# Patient Record
Sex: Female | Born: 1945 | Race: White | Hispanic: No | Marital: Married | Smoking: Former smoker
Health system: Southern US, Community
[De-identification: ages and names within clinical notes are randomized; demographics above are authoritative.]

## PROBLEM LIST (undated history)

## (undated) DIAGNOSIS — K589 Irritable bowel syndrome without diarrhea: Secondary | ICD-10-CM

## (undated) DIAGNOSIS — M719 Bursopathy, unspecified: Secondary | ICD-10-CM

## (undated) DIAGNOSIS — T7840XA Allergy, unspecified, initial encounter: Secondary | ICD-10-CM

## (undated) DIAGNOSIS — F32A Depression, unspecified: Secondary | ICD-10-CM

## (undated) DIAGNOSIS — K5792 Diverticulitis of intestine, part unspecified, without perforation or abscess without bleeding: Secondary | ICD-10-CM

## (undated) HISTORY — DX: Irritable bowel syndrome, unspecified: K58.9

## (undated) HISTORY — PX: APPENDECTOMY: SHX54

## (undated) HISTORY — DX: Bursopathy, unspecified: M71.9

## (undated) HISTORY — DX: Diverticulitis of intestine, part unspecified, without perforation or abscess without bleeding: K57.92

## (undated) HISTORY — DX: Allergy, unspecified, initial encounter: T78.40XA

## (undated) HISTORY — PX: ABDOMINAL HYSTERECTOMY: SHX81

## (undated) HISTORY — DX: Depression, unspecified: F32.A

---

## 2000-12-05 ENCOUNTER — Ambulatory Visit (HOSPITAL_COMMUNITY): Admission: RE | Admit: 2000-12-05 | Discharge: 2000-12-05 | Payer: Self-pay | Admitting: *Deleted

## 2004-08-05 ENCOUNTER — Other Ambulatory Visit: Admission: RE | Admit: 2004-08-05 | Discharge: 2004-08-05 | Payer: Self-pay | Admitting: Obstetrics and Gynecology

## 2004-08-11 ENCOUNTER — Encounter: Admission: RE | Admit: 2004-08-11 | Discharge: 2004-08-11 | Payer: Self-pay | Admitting: Obstetrics and Gynecology

## 2005-08-06 ENCOUNTER — Other Ambulatory Visit: Admission: RE | Admit: 2005-08-06 | Discharge: 2005-08-06 | Payer: Self-pay | Admitting: Obstetrics and Gynecology

## 2006-08-25 ENCOUNTER — Encounter: Admission: RE | Admit: 2006-08-25 | Discharge: 2006-08-25 | Payer: Self-pay | Admitting: Obstetrics and Gynecology

## 2006-08-31 ENCOUNTER — Encounter: Admission: RE | Admit: 2006-08-31 | Discharge: 2006-08-31 | Payer: Self-pay | Admitting: Obstetrics and Gynecology

## 2006-10-10 ENCOUNTER — Encounter: Admission: RE | Admit: 2006-10-10 | Discharge: 2006-10-10 | Payer: Self-pay | Admitting: Orthopedic Surgery

## 2007-03-03 ENCOUNTER — Ambulatory Visit (HOSPITAL_BASED_OUTPATIENT_CLINIC_OR_DEPARTMENT_OTHER): Admission: RE | Admit: 2007-03-03 | Discharge: 2007-03-03 | Payer: Self-pay | Admitting: Orthopedic Surgery

## 2007-09-05 ENCOUNTER — Encounter: Admission: RE | Admit: 2007-09-05 | Discharge: 2007-09-05 | Payer: Self-pay | Admitting: Family Medicine

## 2008-09-05 ENCOUNTER — Encounter: Admission: RE | Admit: 2008-09-05 | Discharge: 2008-09-05 | Payer: Self-pay | Admitting: Obstetrics and Gynecology

## 2009-09-08 ENCOUNTER — Encounter: Admission: RE | Admit: 2009-09-08 | Discharge: 2009-09-08 | Payer: Self-pay | Admitting: Obstetrics and Gynecology

## 2010-04-17 ENCOUNTER — Encounter: Admission: RE | Admit: 2010-04-17 | Discharge: 2010-04-17 | Payer: Self-pay | Admitting: Family Medicine

## 2010-08-03 ENCOUNTER — Other Ambulatory Visit: Payer: Self-pay | Admitting: Obstetrics and Gynecology

## 2010-08-03 DIAGNOSIS — Z1231 Encounter for screening mammogram for malignant neoplasm of breast: Secondary | ICD-10-CM

## 2010-09-10 ENCOUNTER — Ambulatory Visit: Payer: Self-pay

## 2010-09-11 ENCOUNTER — Ambulatory Visit: Payer: Self-pay

## 2010-09-17 ENCOUNTER — Ambulatory Visit
Admission: RE | Admit: 2010-09-17 | Discharge: 2010-09-17 | Disposition: A | Discharge status: Home / Self Care | Payer: Medicare Other | Source: Ambulatory Visit | Attending: Obstetrics and Gynecology | Admitting: Obstetrics and Gynecology

## 2010-09-17 DIAGNOSIS — Z1231 Encounter for screening mammogram for malignant neoplasm of breast: Secondary | ICD-10-CM

## 2010-10-13 NOTE — Op Note (Signed)
Erin Sampson, BLOODSAW              ACCOUNT NO.:  [id number]   MEDICAL RECORD NO.:  [id number]          PATIENT TYPE:  AMB   LOCATION:  DSC                          FACILITY:  [facility]   PHYSICIAN:  Cindee Salt, M.D.       DATE OF BIRTH:  06-Aug-1945   DATE OF PROCEDURE:  03/03/2007  DATE OF DISCHARGE:                               OPERATIVE REPORT   PREOPERATIVE DIAGNOSIS:  Wrist pain with ulnocarpal abutment, TFCC  tears, cyst lunate, right wrist.   POSTOPERATIVE DIAGNOSIS:  Wrist pain with ulnocarpal abutment, TFCC  tears, cyst lunate, right wrist, ulnar carpal ligament tear avulsion,  ulnocarpal abutment TFCC tear.   OPERATION:  Arthroscopy, debridement of volar ulnar ligament tear,  debridement TFCC, Gibson Ramp arthroplasty, right wrist.   SURGEON:  Cindee Salt, M.D.   ASSISTANT:  Carolyne Fiscal R.N.   ANESTHESIA:  General.   HISTORY:  The patient is a 65 year old female with a history of pain on  the ulnar aspect of her wrist.  She has had an MRI revealing a large  cyst in the ulnar aspect of her lunate, a TFCC tear, ulnocarpal abutment  with positive bone scan. She has elected to undergo arthroscopy with the  possibility of bone grafting, debridement of the TFCC and [facility]  arthroplasty.  She is aware of risks and complications including  infection, recurrence, injury to arteries, nerves, or tendons,  incomplete relief of symptoms, dystrophy.  In the preoperative area,  questions were encouraged and answered.  The extremity was marked by  both the patient and surgeon.  Antibiotic given.   PROCEDURE:  The patient is brought to the operating room where a general  anesthetic was carried out without difficulty.  She was prepped using  DuraPrep, supine position, right arm free.  The limb was placed in the  arthroscopy tower, 10 pounds traction applied, the joint inflated  through the 3/4 portal.  A transverse incision was made through skin  only, deepened with a hemostat, a blunt trocar was  used enter the joint.  The joint was inspected.  A small tear of the proximal aspect  scapholunate ligament was noted.  No gross instability was noted.  The  volar radial ligaments were intact.  The ulnar carpal ligaments were  found to be avulsed and pushed within the joint.  The TFCC showed a tear  with humping up of the TFCC by the ulnar head.  An irrigation catheter  was placed and secured, a 4/5 portal opened. After localization with a  22 gauge needle, the ulnar aspect of the joint was inspected.  A  significant injury to the ulnar side of the lunate was noted with an  avulsion of a large portion of articular cartilage.  The scope was then  reintroduced in the 3/4 portal and ArthroCare wand was used for the  doing a partial synovectomy, opening the triangle fibrocartilage complex  along with a full radius shaver.  The loose cartilage from the lunate  was then debrided. The subchondral bone was found to be intact.  The  large avulsion of the volar ligaments was noted.  These were enfolded  into the joint. The mid carpal joint was then inspected through the  radial mid carpal portal after localization with a 22 gauge needle.  The  scope was introduced.  The STT joint showed no significant changes.  There were no changes of the proximal capitate to the distal carpal  bones. Minimal changes with a type 2 lunate onto the proximal aspect of  the hamate. No gross instability of the lunotriquetral joint was noted.  The scope was then reintroduced into the 3/4 portal where complete  debridement of the volar ligaments was then performed using a suction  basket. The TFCC was removed with the suction basket, the ArthroCare  wand, and full radius shaver.  The cartilage remaining on the distal  ulna was then removed with a full radius shaver and a bur was then used  to remove approximately 2-3 mm of distal ulna.  X-rays confirmed that  there was complete removal of the ulna to approximately 1-2 mm  short of  the distal radius.  It was decided not to proceed with bone grafting of  the lunate in that this was probably secondary to the avulsion of the  volar ligaments rather than the ulnocarpal abutment. The instruments  were removed.  The portals were closed with interrupted 5-0 Vicryl  Rapide sutures.  A sterile compressive dressing and splint was applied.  The patient tolerated the procedure well and was taken to the recovery  room for observation in satisfactory condition.  She will be discharged  home to return to the [facility] [place] in one week on [place]  NX.           ______________________________  Cindee Salt, M.D.     GK/MEDQ  D:  03/03/2007  T:  03/04/2007  Job:  [id number]   cc:   C. Duane Lope, M.D.

## 2011-06-07 DIAGNOSIS — Z23 Encounter for immunization: Secondary | ICD-10-CM | POA: Diagnosis not present

## 2011-06-25 DIAGNOSIS — J329 Chronic sinusitis, unspecified: Secondary | ICD-10-CM | POA: Diagnosis not present

## 2011-07-15 DIAGNOSIS — L570 Actinic keratosis: Secondary | ICD-10-CM | POA: Diagnosis not present

## 2011-07-15 DIAGNOSIS — L708 Other acne: Secondary | ICD-10-CM | POA: Diagnosis not present

## 2011-08-05 DIAGNOSIS — L578 Other skin changes due to chronic exposure to nonionizing radiation: Secondary | ICD-10-CM | POA: Diagnosis not present

## 2011-08-05 DIAGNOSIS — L708 Other acne: Secondary | ICD-10-CM | POA: Diagnosis not present

## 2011-08-16 DIAGNOSIS — B9789 Other viral agents as the cause of diseases classified elsewhere: Secondary | ICD-10-CM | POA: Diagnosis not present

## 2011-08-19 DIAGNOSIS — L578 Other skin changes due to chronic exposure to nonionizing radiation: Secondary | ICD-10-CM | POA: Diagnosis not present

## 2011-08-19 DIAGNOSIS — L570 Actinic keratosis: Secondary | ICD-10-CM | POA: Diagnosis not present

## 2011-08-19 DIAGNOSIS — L708 Other acne: Secondary | ICD-10-CM | POA: Diagnosis not present

## 2011-09-27 DIAGNOSIS — M545 Low back pain: Secondary | ICD-10-CM | POA: Diagnosis not present

## 2011-09-27 DIAGNOSIS — M1990 Unspecified osteoarthritis, unspecified site: Secondary | ICD-10-CM | POA: Diagnosis not present

## 2011-10-08 DIAGNOSIS — R350 Frequency of micturition: Secondary | ICD-10-CM | POA: Diagnosis not present

## 2011-10-08 DIAGNOSIS — N390 Urinary tract infection, site not specified: Secondary | ICD-10-CM | POA: Diagnosis not present

## 2012-01-14 DIAGNOSIS — J069 Acute upper respiratory infection, unspecified: Secondary | ICD-10-CM | POA: Diagnosis not present

## 2012-03-13 ENCOUNTER — Other Ambulatory Visit: Payer: Self-pay | Admitting: Obstetrics and Gynecology

## 2012-03-13 DIAGNOSIS — Z1231 Encounter for screening mammogram for malignant neoplasm of breast: Secondary | ICD-10-CM

## 2012-04-10 ENCOUNTER — Ambulatory Visit
Admission: RE | Admit: 2012-04-10 | Discharge: 2012-04-10 | Disposition: A | Discharge status: Home / Self Care | Payer: Medicare Other | Source: Ambulatory Visit | Attending: Obstetrics and Gynecology | Admitting: Obstetrics and Gynecology

## 2012-04-10 DIAGNOSIS — Z1231 Encounter for screening mammogram for malignant neoplasm of breast: Secondary | ICD-10-CM | POA: Diagnosis not present

## 2012-06-28 DIAGNOSIS — L259 Unspecified contact dermatitis, unspecified cause: Secondary | ICD-10-CM | POA: Diagnosis not present

## 2012-06-28 DIAGNOSIS — L723 Sebaceous cyst: Secondary | ICD-10-CM | POA: Diagnosis not present

## 2012-08-01 DIAGNOSIS — J329 Chronic sinusitis, unspecified: Secondary | ICD-10-CM | POA: Diagnosis not present

## 2012-08-01 DIAGNOSIS — R42 Dizziness and giddiness: Secondary | ICD-10-CM | POA: Diagnosis not present

## 2012-08-25 DIAGNOSIS — J329 Chronic sinusitis, unspecified: Secondary | ICD-10-CM | POA: Diagnosis not present

## 2012-11-28 DIAGNOSIS — J069 Acute upper respiratory infection, unspecified: Secondary | ICD-10-CM | POA: Diagnosis not present

## 2013-03-14 DIAGNOSIS — H43399 Other vitreous opacities, unspecified eye: Secondary | ICD-10-CM | POA: Diagnosis not present

## 2013-03-15 ENCOUNTER — Other Ambulatory Visit: Payer: Self-pay | Admitting: Family Medicine

## 2013-03-15 DIAGNOSIS — H539 Unspecified visual disturbance: Secondary | ICD-10-CM

## 2013-03-15 DIAGNOSIS — R4182 Altered mental status, unspecified: Secondary | ICD-10-CM | POA: Diagnosis not present

## 2013-03-16 DIAGNOSIS — H539 Unspecified visual disturbance: Secondary | ICD-10-CM | POA: Diagnosis not present

## 2013-03-16 DIAGNOSIS — R4182 Altered mental status, unspecified: Secondary | ICD-10-CM | POA: Diagnosis not present

## 2013-03-20 DIAGNOSIS — R4182 Altered mental status, unspecified: Secondary | ICD-10-CM | POA: Diagnosis not present

## 2013-03-20 DIAGNOSIS — I369 Nonrheumatic tricuspid valve disorder, unspecified: Secondary | ICD-10-CM | POA: Diagnosis not present

## 2013-03-27 DIAGNOSIS — Z23 Encounter for immunization: Secondary | ICD-10-CM | POA: Diagnosis not present

## 2013-04-24 DIAGNOSIS — J029 Acute pharyngitis, unspecified: Secondary | ICD-10-CM | POA: Diagnosis not present

## 2013-04-24 DIAGNOSIS — M545 Low back pain: Secondary | ICD-10-CM | POA: Diagnosis not present

## 2013-06-08 DIAGNOSIS — H903 Sensorineural hearing loss, bilateral: Secondary | ICD-10-CM | POA: Diagnosis not present

## 2013-06-08 DIAGNOSIS — H905 Unspecified sensorineural hearing loss: Secondary | ICD-10-CM | POA: Diagnosis not present

## 2013-06-12 DIAGNOSIS — J069 Acute upper respiratory infection, unspecified: Secondary | ICD-10-CM | POA: Diagnosis not present

## 2013-08-06 ENCOUNTER — Other Ambulatory Visit: Payer: Self-pay

## 2013-08-06 DIAGNOSIS — Z1231 Encounter for screening mammogram for malignant neoplasm of breast: Secondary | ICD-10-CM

## 2013-08-07 DIAGNOSIS — H905 Unspecified sensorineural hearing loss: Secondary | ICD-10-CM | POA: Diagnosis not present

## 2013-08-21 ENCOUNTER — Ambulatory Visit
Admission: RE | Admit: 2013-08-21 | Discharge: 2013-08-21 | Disposition: A | Discharge status: Home / Self Care | Payer: Medicare Other | Source: Ambulatory Visit

## 2013-08-21 DIAGNOSIS — Z1231 Encounter for screening mammogram for malignant neoplasm of breast: Secondary | ICD-10-CM | POA: Diagnosis not present

## 2013-08-22 ENCOUNTER — Encounter: Payer: Self-pay | Admitting: General Surgery

## 2013-08-24 DIAGNOSIS — J019 Acute sinusitis, unspecified: Secondary | ICD-10-CM | POA: Diagnosis not present

## 2013-08-27 ENCOUNTER — Ambulatory Visit (INDEPENDENT_AMBULATORY_CARE_PROVIDER_SITE_OTHER): Payer: Medicare Other | Admitting: Cardiology

## 2013-08-27 ENCOUNTER — Encounter: Payer: Self-pay | Admitting: Cardiology

## 2013-08-27 VITALS — BP 132/78 | HR 70 | Ht 65.0 in | Wt 164.0 lb

## 2013-08-27 DIAGNOSIS — I4891 Unspecified atrial fibrillation: Secondary | ICD-10-CM

## 2013-08-27 DIAGNOSIS — G459 Transient cerebral ischemic attack, unspecified: Secondary | ICD-10-CM | POA: Diagnosis not present

## 2013-08-27 NOTE — Patient Instructions (Addendum)
Your physician recommends that you continue on your current medications as directed. Please refer to the Current Medication list given to you today.  Your physician has recommended that you wear an event monitor. Event monitors are medical devices that record the heart's electrical activity. Doctors most often [place] these monitors to diagnose arrhythmias. Arrhythmias are problems with the speed or rhythm of the heartbeat. The monitor is a small, portable device. You can wear one while you do your normal daily activities. This is usually used to diagnose what is causing palpitations/syncope (passing out).  You have been referred to [facility] [address] [place], [place] [address] Tel: [phone] Fax: [phone]  Your physician recommends that you schedule a follow-up appointment in: 4 Weeks with Dr Radford Pax

## 2013-08-27 NOTE — Progress Notes (Signed)
[address] [place], [place]  [address] Phone: [phone] Fax:  [phone]  Date:  08/27/2013   ID:  Amberlynn, Tempesta [birth year], MRN [id number]  PCP:   Melinda Crutch, MD  Cardiologist:  Fransico Him, MD     History of Present Illness: Erin Sampson is a 68 y.o. female with no prior cardiac history who is referred now by her PCP to evaluate for possible cardiac etiology of recent TIA.  She says that last October during supper her vision got very abnormal and cleared very quickly.  She went and did dishes and then could not read the list of her husband's meds to put in his med container and then could not talk right.  It resolved and went to bed and then next day she decided to go to the eye MD.  They could not find anything wrong.  She went to see her PCP and she was sent to have carotid dopplers, MRI of her head and echo all of which were normal.  She was placed on ASA daily as well but has not seen Dr. Drema Dallas since then.  She recently saw her ENT due to hearing problems and was then referred to Dr. Claria Dice and her hearing was fine and he sent her to be evaluated by Cardiology.  She has not been seen by Neurology.  She denies any chest pain, SOB, DOE, LE edema, palpitations or syncope. She has had some vertigo in the past.     Wt Readings from Last 3 Encounters:  08/27/13 164 lb (74.39 kg)     Past Medical History  Diagnosis Date  . IBS (irritable bowel syndrome)   . Diverticulitis   . Bursitis     Current Outpatient Prescriptions  Medication Sig Dispense Refill  . aspirin 81 MG tablet Take 325 mg by mouth daily.       . calcium carbonate (OS-CAL) 600 MG TABS tablet Take 600 mg by mouth 2 (two) times daily with a meal.      . Cetirizine HCl 10 MG CAPS Take by mouth.      . cholecalciferol (VITAMIN D) 1000 UNITS tablet Take 3,000 Units by mouth daily.       No current facility-administered medications for this visit.    Allergies:    Allergies  Allergen  Reactions  . Codeine Itching    Social History:  The patient  reports that she has quit smoking. She does not have any smokeless tobacco history on file. She reports that she does not drink alcohol or use illicit drugs.   Family History:  The patient's family history is not on file.   ROS:  Please see the history of present illness.      All other systems reviewed and negative.   PHYSICAL EXAM: VS:  Ht 5\' 5"  (1.651 m)  Wt 164 lb (74.39 kg)  BMI 27.29 kg/m2 Well nourished, well developed, in no acute distress HEENT: normal Neck: no JVD Cardiac:  normal S1, S2; RRR; no murmur Lungs:  clear to auscultation bilaterally, no wheezing, rhonchi or rales Abd: soft, nontender, no hepatomegaly Ext: no edema Skin: warm and dry Neuro:  CNs 2-12 intact, no focal abnormalities noted  EKG:  NSR with LAFB and poor R wave progression in anterior leads  ASSESSMENT AND PLAN:  1. ? TIA which has not been verified by a Neurologist.  Apparently an MRI was normal of her head but I do not have any records  for that.  2D echo showed normal LVF.  I will get a [employer] monitor to assess for silent PAF but I also think she needs to see a neurologist. I will get a copy of the 2D echo report.  Followup with me in 4 weeks  Signed, Fransico Him, MD 08/27/2013 11:18 AM

## 2013-08-29 ENCOUNTER — Encounter (INDEPENDENT_AMBULATORY_CARE_PROVIDER_SITE_OTHER): Payer: Medicare Other

## 2013-08-29 ENCOUNTER — Encounter: Payer: Self-pay | Admitting: *Deleted

## 2013-08-29 DIAGNOSIS — I4891 Unspecified atrial fibrillation: Secondary | ICD-10-CM

## 2013-08-29 NOTE — Progress Notes (Signed)
Patient ID: Erin Sampson, female   DOB: 10/19/1945, 68 y.o.   MRN: [id number] [employer] 30 day cardiac event monitor applied to patient.

## 2013-09-03 ENCOUNTER — Ambulatory Visit: Payer: Self-pay | Admitting: Neurology

## 2013-09-06 ENCOUNTER — Ambulatory Visit: Payer: Self-pay | Admitting: Neurology

## 2013-09-24 ENCOUNTER — Telehealth: Payer: Self-pay | Admitting: Cardiology

## 2013-09-24 NOTE — Telephone Encounter (Signed)
TO Valetta Fuller. Please check on this before [place] letting pt know she does not need to continue with monitor,

## 2013-09-24 NOTE — Telephone Encounter (Signed)
New message     Wearing a monitor.  [employer] people told pt she had a faulty sensor. They will send her a new monitor, however, she is only days away to the end of her time for wearing the monitor.  Do we have enough data so that she can stop wearing the monitor and turn it in?  She is due to take it off April 30th.

## 2013-09-24 NOTE — Telephone Encounter (Signed)
TO Dr Turner to advise.  

## 2013-09-24 NOTE — Telephone Encounter (Signed)
Ok to send the monitor in but please check with [employer] to make sure that monitor just became faulty and was not faulty the entire time she wore it

## 2013-09-25 NOTE — Telephone Encounter (Signed)
Follow up     OK to leave a message on the machine as to whether pt can take monitor off forever.

## 2013-09-25 NOTE — Telephone Encounter (Signed)
lvm that was have enough information and she can send monitor back 2 days early

## 2013-09-25 NOTE — Telephone Encounter (Signed)
TO Valetta Fuller to make aware.

## 2013-09-27 ENCOUNTER — Telehealth: Payer: Self-pay | Admitting: Cardiology

## 2013-09-27 NOTE — Telephone Encounter (Signed)
No answer. Will call again later this morning.

## 2013-09-27 NOTE — Telephone Encounter (Signed)
New Message  PT called states that the upcoming appt was scheduled as a follow up.. PT states that her previous appt was due to a possible stroke and that Dr. Radford Pax simply wanted to see her to evaluate if she had a stroke. Pt states she wore an event monitor and was advised that she did not have a heart problem.. Requests a call back to determine if an appt is needed. Please call back to discuss.

## 2013-09-27 NOTE — Telephone Encounter (Signed)
Yes, she finished the monitor quite a while back. I called [facility] [phone] (Dr. Leighton Ruff) today to request the records. They will fax them as soon as possible to your attention.

## 2013-09-27 NOTE — Telephone Encounter (Signed)
Heart monitor has not been read yet

## 2013-09-27 NOTE — Telephone Encounter (Signed)
Patient called to cancel upcoming appt with Dr. Radford Pax in May that was for follow-up of cardiac monitor results. Patient states results came back as "normal, I don't have AFib", so she is cancelling appointment. Erin Sampson does have a May 4th appointment with Dr. Janann Colonel, of [facility].

## 2013-09-27 NOTE — Telephone Encounter (Signed)
Please get a copy of the echo that was done by her Neurologist or PCP.  Has she finished the monitor?

## 2013-10-01 ENCOUNTER — Encounter: Payer: Self-pay | Admitting: Neurology

## 2013-10-01 ENCOUNTER — Ambulatory Visit (INDEPENDENT_AMBULATORY_CARE_PROVIDER_SITE_OTHER): Payer: Medicare Other | Admitting: Neurology

## 2013-10-01 ENCOUNTER — Encounter (INDEPENDENT_AMBULATORY_CARE_PROVIDER_SITE_OTHER): Payer: Self-pay

## 2013-10-01 VITALS — BP 132/74 | HR 71 | Ht 65.0 in | Wt 166.0 lb

## 2013-10-01 DIAGNOSIS — G459 Transient cerebral ischemic attack, unspecified: Secondary | ICD-10-CM

## 2013-10-01 NOTE — Patient Instructions (Signed)
Overall you are doing fairly well but I do want to suggest a few things today:   Remember to drink plenty of fluid, eat healthy meals and do not skip any meals. Try to eat protein with a every meal and eat a healthy snack such as fruit or nuts in between meals. Try to keep a regular sleep-wake schedule and try to exercise daily, particularly in the form of walking, 20-30 minutes a day, if you can.   As far as your medications are concerned, I would like to suggest you continue taking a daily aspirin 325mg   As far as diagnostic testing:  1)Please have your MRI and carotid doppler results faxed to our office  We will hold off on further testing at this time  I would like to see you back as needed. If these symptoms return please call 911. Please call us with any interim questions, concerns, problems, updates or refill requests.   My clinical assistant and will answer any of your questions and relay your messages to me and also relay most of my messages to you.   Our phone number is [phone]. We also have an after hours call service for urgent matters and there is a physician on-call for urgent questions. For any emergencies you know to call 911 or go to the nearest emergency room

## 2013-10-01 NOTE — Progress Notes (Signed)
[facility]    Provider:  Dr Janann Colonel Referring Provider: Melinda Crutch, MD Primary Care Physician:   Melinda Crutch, MD  CC:  Question of TIA  HPI:  Erin Sampson is a 68 y.o. female here as a referral from Dr. Harrington Challenger for visual changes, possible TIA  Last October, notes acute onset of blurred and double vision, which cleared after 1-2 minutes. Friend noted difficulty with her speech (slurred) and confusion shortly after this. This lasted around 20  minutes and then resolved. Patient was aware something wasn't right but was able to complete the dishes. Had extensive workup, including visit with eye doctor, carotid dopplers, MRI brain, and 2D echo, all reported to be normal (reports and images not available for review). During the event, was taking ASA 81mg  daily and now taking 4 of the 81mg . Recently evaluated by cardiology, had monitor to check for PAF, patient reports it was unremarkable. She is unsure if her cholesterol was checked.   Denies any fevers, illnesses or anything abnormal that day. Had not missed a meal, had hydrated well. No known episodes of staring blankly, no eye blinking or lip smacking, no extremity twitching. No known history of stroke or TIA, no prior episodes similar to this. No known DM or HTN. Reports good cholesterol levels. Exercises and eats healthy.   Review of Systems: Out of a complete 14 system review, the patient complains of only the following symptoms, and all other reviewed systems are negative. +easy bruising, memory loss, feeling hot, feeling cold, leg cramps  History   Social History  . Marital Status: Married    Spouse Name: N/A    Number of Children: N/A  . Years of Education: N/A   Occupational History  . Not on file.   Social History Main Topics  . Smoking status: Former Research scientist (life sciences)  . Smokeless tobacco: Not on file  . Alcohol Use: No  . Drug Use: No  . Sexual Activity: Not on file   Other Topics Concern  . Not on file    Social History Narrative   Married, with 2 children   2 yrs of college   Right handed   3-4 cups daily    No family history on file.  Past Medical History  Diagnosis Date  . IBS (irritable bowel syndrome)   . Diverticulitis   . Bursitis     No past surgical history on file.  Current Outpatient Prescriptions  Medication Sig Dispense Refill  . aspirin 81 MG tablet Take 325 mg by mouth daily.       . calcium carbonate (OS-CAL) 600 MG TABS tablet Take 600 mg by mouth 2 (two) times daily with a meal.      . Cetirizine HCl 10 MG CAPS Take by mouth.      . cholecalciferol (VITAMIN D) 1000 UNITS tablet Take 3,000 Units by mouth daily.       No current facility-administered medications for this visit.    Allergies as of 10/01/2013 - Review Complete 10/01/2013  Allergen Reaction Noted  . Codeine Itching 08/22/2013    Vitals: BP 132/74  Pulse 71  Ht 5\' 5"  (1.651 m)  Wt 166 lb (75.297 kg)  BMI 27.62 kg/m2 Last Weight:  Wt Readings from Last 1 Encounters:  10/01/13 166 lb (75.297 kg)   Last Height:   Ht Readings from Last 1 Encounters:  10/01/13 5\' 5"  (1.651 m)     Physical exam: Exam: Gen: NAD, conversant Eyes: anicteric sclerae, moist conjunctivae  HENT: Atraumatic, oropharynx clear Neck: Trachea midline; supple,  Lungs: CTA, no wheezing, rales, rhonic                          CV: RRR, no MRG, no carotid bruits Abdomen: Soft, non-tender;  Extremities: No peripheral edema  Skin: Normal temperature, no rash,  Psych: Appropriate affect, pleasant  Neuro: MS: AA&Ox3, appropriately interactive, normal affect   Speech: fluent w/o paraphasic error  Memory: good recent and remote recall  CN: PERRL, EOMI no nystagmus, no ptosis, sensation intact to LT V1-V3 bilat, face symmetric, no weakness, hearing grossly intact, palate elevates symmetrically, shoulder shrug 5/5 bilat,  tongue protrudes midline, no fasiculations noted.  Motor: normal bulk and  tone Strength: 5/5  In all extremities  Coord: rapid alternating and point-to-point (FNF, HTS) movements intact.  Reflexes: symmetrical, bilat downgoing toes  Sens: LT intact in all extremities  Gait: posture, stance, stride and arm-swing normal. Tandem gait intact. Able to walk on heels and toes. Romberg absent.   Assessment:  After physical and neurologic examination, review of laboratory studies, imaging, neurophysiology testing and pre-existing records, assessment will be reviewed on the problem list.  Plan:  Treatment plan and additional workup will be reviewed under Problem List.  1)TIA  68y/o woman presenting for initial evaluation of transient episode of vision change and dysarthria/AMS. Based on clinical history the episode is concerning for a TIA, differential would also include a complex partial seizure vs metabolic/hypoglycemia though these are less likely based on clinical history. Will hold off on EEG at this time, can consider if symptoms return. Per patient, the workup, including MRI brain, carotid doppler, echo and cardiac monitoring was unremarkable. Asked patient to fax records to our office for further review. Will continue on ASA 325mg  daily. Patient to have lipid and Hemoglobin A1c checked with PCP if not already completed. Depending on LDL level, may need to be on a statin. Follow up as needed. Counseled patient to call 911 if symptoms return.    Jim Like, DO  [facility] [address] [place], [place] [address]  Phone [phone] Fax [phone]

## 2013-10-03 ENCOUNTER — Telehealth: Payer: Self-pay | Admitting: Cardiology

## 2013-10-03 NOTE — Telephone Encounter (Signed)
Please let patient know that heart monitor showed NSR from 61-102bpm.  There were occasional PAC's and PVC's which are benign

## 2013-10-04 ENCOUNTER — Ambulatory Visit: Payer: Medicare Other | Admitting: Cardiology

## 2013-10-04 NOTE — Telephone Encounter (Signed)
Pt is aware.  

## 2013-10-29 NOTE — Progress Notes (Signed)
Quick Note:  Informed patient of normal MRI results, no further questions or concerns ______

## 2014-04-05 DIAGNOSIS — L409 Psoriasis, unspecified: Secondary | ICD-10-CM | POA: Diagnosis not present

## 2014-04-05 DIAGNOSIS — J069 Acute upper respiratory infection, unspecified: Secondary | ICD-10-CM | POA: Diagnosis not present

## 2014-05-08 DIAGNOSIS — H524 Presbyopia: Secondary | ICD-10-CM | POA: Diagnosis not present

## 2014-05-08 DIAGNOSIS — H2513 Age-related nuclear cataract, bilateral: Secondary | ICD-10-CM | POA: Diagnosis not present

## 2014-08-26 ENCOUNTER — Other Ambulatory Visit: Payer: Self-pay

## 2014-08-26 DIAGNOSIS — Z1231 Encounter for screening mammogram for malignant neoplasm of breast: Secondary | ICD-10-CM

## 2014-09-02 ENCOUNTER — Ambulatory Visit
Admission: RE | Admit: 2014-09-02 | Discharge: 2014-09-02 | Disposition: A | Discharge status: Home / Self Care | Payer: Medicare Other | Source: Ambulatory Visit

## 2014-09-02 DIAGNOSIS — Z1231 Encounter for screening mammogram for malignant neoplasm of breast: Secondary | ICD-10-CM | POA: Diagnosis not present

## 2014-10-31 DIAGNOSIS — H3531 Nonexudative age-related macular degeneration: Secondary | ICD-10-CM | POA: Diagnosis not present

## 2014-10-31 DIAGNOSIS — H35363 Drusen (degenerative) of macula, bilateral: Secondary | ICD-10-CM | POA: Diagnosis not present

## 2014-11-18 DIAGNOSIS — L821 Other seborrheic keratosis: Secondary | ICD-10-CM | POA: Diagnosis not present

## 2014-11-18 DIAGNOSIS — D225 Melanocytic nevi of trunk: Secondary | ICD-10-CM | POA: Diagnosis not present

## 2014-11-18 DIAGNOSIS — L814 Other melanin hyperpigmentation: Secondary | ICD-10-CM | POA: Diagnosis not present

## 2014-11-18 DIAGNOSIS — D1801 Hemangioma of skin and subcutaneous tissue: Secondary | ICD-10-CM | POA: Diagnosis not present

## 2015-01-21 DIAGNOSIS — R252 Cramp and spasm: Secondary | ICD-10-CM | POA: Diagnosis not present

## 2015-03-12 DIAGNOSIS — H9209 Otalgia, unspecified ear: Secondary | ICD-10-CM | POA: Diagnosis not present

## 2015-03-12 DIAGNOSIS — J069 Acute upper respiratory infection, unspecified: Secondary | ICD-10-CM | POA: Diagnosis not present

## 2015-03-18 DIAGNOSIS — J329 Chronic sinusitis, unspecified: Secondary | ICD-10-CM | POA: Diagnosis not present

## 2015-05-18 DIAGNOSIS — K146 Glossodynia: Secondary | ICD-10-CM | POA: Diagnosis not present

## 2015-06-10 DIAGNOSIS — H524 Presbyopia: Secondary | ICD-10-CM | POA: Diagnosis not present

## 2015-06-10 DIAGNOSIS — H2513 Age-related nuclear cataract, bilateral: Secondary | ICD-10-CM | POA: Diagnosis not present

## 2015-07-02 DIAGNOSIS — M79651 Pain in right thigh: Secondary | ICD-10-CM | POA: Diagnosis not present

## 2015-07-02 DIAGNOSIS — Z23 Encounter for immunization: Secondary | ICD-10-CM | POA: Diagnosis not present

## 2015-09-23 DIAGNOSIS — Z90710 Acquired absence of both cervix and uterus: Secondary | ICD-10-CM | POA: Insufficient documentation

## 2015-09-23 DIAGNOSIS — M810 Age-related osteoporosis without current pathological fracture: Secondary | ICD-10-CM | POA: Diagnosis not present

## 2015-09-23 DIAGNOSIS — Z1231 Encounter for screening mammogram for malignant neoplasm of breast: Secondary | ICD-10-CM | POA: Diagnosis not present

## 2015-09-23 DIAGNOSIS — Z01419 Encounter for gynecological examination (general) (routine) without abnormal findings: Secondary | ICD-10-CM | POA: Diagnosis not present

## 2015-09-24 DIAGNOSIS — B349 Viral infection, unspecified: Secondary | ICD-10-CM | POA: Diagnosis not present

## 2015-09-24 DIAGNOSIS — I951 Orthostatic hypotension: Secondary | ICD-10-CM | POA: Diagnosis not present

## 2015-09-24 DIAGNOSIS — R42 Dizziness and giddiness: Secondary | ICD-10-CM | POA: Diagnosis not present

## 2015-11-13 DIAGNOSIS — L821 Other seborrheic keratosis: Secondary | ICD-10-CM | POA: Diagnosis not present

## 2015-11-13 DIAGNOSIS — L219 Seborrheic dermatitis, unspecified: Secondary | ICD-10-CM | POA: Diagnosis not present

## 2015-11-13 DIAGNOSIS — L814 Other melanin hyperpigmentation: Secondary | ICD-10-CM | POA: Diagnosis not present

## 2015-11-25 DIAGNOSIS — M810 Age-related osteoporosis without current pathological fracture: Secondary | ICD-10-CM | POA: Diagnosis not present

## 2015-11-25 DIAGNOSIS — Z23 Encounter for immunization: Secondary | ICD-10-CM | POA: Diagnosis not present

## 2015-11-25 DIAGNOSIS — Z136 Encounter for screening for cardiovascular disorders: Secondary | ICD-10-CM | POA: Diagnosis not present

## 2015-11-25 DIAGNOSIS — Z0000 Encounter for general adult medical examination without abnormal findings: Secondary | ICD-10-CM | POA: Diagnosis not present

## 2015-12-16 DIAGNOSIS — J3089 Other allergic rhinitis: Secondary | ICD-10-CM | POA: Diagnosis not present

## 2015-12-16 DIAGNOSIS — R05 Cough: Secondary | ICD-10-CM | POA: Diagnosis not present

## 2015-12-16 DIAGNOSIS — J370 Chronic laryngitis: Secondary | ICD-10-CM | POA: Diagnosis not present

## 2015-12-31 DIAGNOSIS — J039 Acute tonsillitis, unspecified: Secondary | ICD-10-CM | POA: Diagnosis not present

## 2015-12-31 DIAGNOSIS — J322 Chronic ethmoidal sinusitis: Secondary | ICD-10-CM | POA: Diagnosis not present

## 2015-12-31 DIAGNOSIS — R490 Dysphonia: Secondary | ICD-10-CM | POA: Diagnosis not present

## 2015-12-31 DIAGNOSIS — J320 Chronic maxillary sinusitis: Secondary | ICD-10-CM | POA: Diagnosis not present

## 2015-12-31 DIAGNOSIS — J040 Acute laryngitis: Secondary | ICD-10-CM | POA: Diagnosis not present

## 2015-12-31 DIAGNOSIS — J301 Allergic rhinitis due to pollen: Secondary | ICD-10-CM | POA: Diagnosis not present

## 2016-01-07 DIAGNOSIS — R490 Dysphonia: Secondary | ICD-10-CM | POA: Diagnosis not present

## 2016-01-07 DIAGNOSIS — J301 Allergic rhinitis due to pollen: Secondary | ICD-10-CM | POA: Diagnosis not present

## 2016-01-07 DIAGNOSIS — J370 Chronic laryngitis: Secondary | ICD-10-CM | POA: Diagnosis not present

## 2016-03-16 DIAGNOSIS — N952 Postmenopausal atrophic vaginitis: Secondary | ICD-10-CM | POA: Diagnosis not present

## 2016-08-31 DIAGNOSIS — H2513 Age-related nuclear cataract, bilateral: Secondary | ICD-10-CM | POA: Diagnosis not present

## 2016-09-01 DIAGNOSIS — J019 Acute sinusitis, unspecified: Secondary | ICD-10-CM | POA: Diagnosis not present

## 2016-10-05 DIAGNOSIS — Z01419 Encounter for gynecological examination (general) (routine) without abnormal findings: Secondary | ICD-10-CM | POA: Diagnosis not present

## 2016-10-05 DIAGNOSIS — Z6826 Body mass index (BMI) 26.0-26.9, adult: Secondary | ICD-10-CM | POA: Diagnosis not present

## 2016-10-05 DIAGNOSIS — M810 Age-related osteoporosis without current pathological fracture: Secondary | ICD-10-CM | POA: Diagnosis not present

## 2016-10-05 DIAGNOSIS — R3915 Urgency of urination: Secondary | ICD-10-CM | POA: Diagnosis not present

## 2016-10-05 DIAGNOSIS — Z1231 Encounter for screening mammogram for malignant neoplasm of breast: Secondary | ICD-10-CM | POA: Diagnosis not present

## 2016-10-05 DIAGNOSIS — N3281 Overactive bladder: Secondary | ICD-10-CM | POA: Diagnosis not present

## 2016-10-05 DIAGNOSIS — R351 Nocturia: Secondary | ICD-10-CM | POA: Diagnosis not present

## 2016-11-01 DIAGNOSIS — J3089 Other allergic rhinitis: Secondary | ICD-10-CM | POA: Diagnosis not present

## 2016-12-21 DIAGNOSIS — F329 Major depressive disorder, single episode, unspecified: Secondary | ICD-10-CM | POA: Diagnosis not present

## 2016-12-21 DIAGNOSIS — Z23 Encounter for immunization: Secondary | ICD-10-CM | POA: Diagnosis not present

## 2016-12-21 DIAGNOSIS — Z131 Encounter for screening for diabetes mellitus: Secondary | ICD-10-CM | POA: Diagnosis not present

## 2016-12-21 DIAGNOSIS — Z0000 Encounter for general adult medical examination without abnormal findings: Secondary | ICD-10-CM | POA: Diagnosis not present

## 2016-12-21 DIAGNOSIS — E785 Hyperlipidemia, unspecified: Secondary | ICD-10-CM | POA: Diagnosis not present

## 2016-12-21 DIAGNOSIS — Z136 Encounter for screening for cardiovascular disorders: Secondary | ICD-10-CM | POA: Diagnosis not present

## 2017-02-10 DIAGNOSIS — J019 Acute sinusitis, unspecified: Secondary | ICD-10-CM | POA: Diagnosis not present

## 2017-03-01 DIAGNOSIS — H9311 Tinnitus, right ear: Secondary | ICD-10-CM | POA: Diagnosis not present

## 2017-03-01 DIAGNOSIS — H9313 Tinnitus, bilateral: Secondary | ICD-10-CM | POA: Diagnosis not present

## 2017-03-01 DIAGNOSIS — H8143 Vertigo of central origin, bilateral: Secondary | ICD-10-CM | POA: Diagnosis not present

## 2017-03-01 DIAGNOSIS — H903 Sensorineural hearing loss, bilateral: Secondary | ICD-10-CM | POA: Diagnosis not present

## 2017-03-04 DIAGNOSIS — L728 Other follicular cysts of the skin and subcutaneous tissue: Secondary | ICD-10-CM | POA: Diagnosis not present

## 2017-03-04 DIAGNOSIS — D1801 Hemangioma of skin and subcutaneous tissue: Secondary | ICD-10-CM | POA: Diagnosis not present

## 2017-03-04 DIAGNOSIS — L821 Other seborrheic keratosis: Secondary | ICD-10-CM | POA: Diagnosis not present

## 2017-03-04 DIAGNOSIS — D225 Melanocytic nevi of trunk: Secondary | ICD-10-CM | POA: Diagnosis not present

## 2017-03-04 DIAGNOSIS — L814 Other melanin hyperpigmentation: Secondary | ICD-10-CM | POA: Diagnosis not present

## 2017-04-22 DIAGNOSIS — J00 Acute nasopharyngitis [common cold]: Secondary | ICD-10-CM | POA: Diagnosis not present

## 2017-07-20 DIAGNOSIS — L659 Nonscarring hair loss, unspecified: Secondary | ICD-10-CM | POA: Diagnosis not present

## 2017-07-20 DIAGNOSIS — R946 Abnormal results of thyroid function studies: Secondary | ICD-10-CM | POA: Diagnosis not present

## 2017-07-20 DIAGNOSIS — R5383 Other fatigue: Secondary | ICD-10-CM | POA: Diagnosis not present

## 2017-07-20 DIAGNOSIS — R6889 Other general symptoms and signs: Secondary | ICD-10-CM | POA: Diagnosis not present

## 2017-07-26 ENCOUNTER — Other Ambulatory Visit: Payer: Self-pay | Admitting: Family Medicine

## 2017-07-26 DIAGNOSIS — R946 Abnormal results of thyroid function studies: Secondary | ICD-10-CM

## 2017-07-29 ENCOUNTER — Ambulatory Visit
Admission: RE | Admit: 2017-07-29 | Discharge: 2017-07-29 | Disposition: A | Discharge status: Home / Self Care | Payer: Medicare Other | Source: Ambulatory Visit | Attending: Family Medicine | Admitting: Family Medicine

## 2017-07-29 DIAGNOSIS — E041 Nontoxic single thyroid nodule: Secondary | ICD-10-CM | POA: Diagnosis not present

## 2017-07-29 DIAGNOSIS — R946 Abnormal results of thyroid function studies: Secondary | ICD-10-CM

## 2017-08-08 DIAGNOSIS — J019 Acute sinusitis, unspecified: Secondary | ICD-10-CM | POA: Diagnosis not present

## 2017-08-24 DIAGNOSIS — L218 Other seborrheic dermatitis: Secondary | ICD-10-CM | POA: Diagnosis not present

## 2017-08-24 DIAGNOSIS — D225 Melanocytic nevi of trunk: Secondary | ICD-10-CM | POA: Diagnosis not present

## 2017-08-24 DIAGNOSIS — L820 Inflamed seborrheic keratosis: Secondary | ICD-10-CM | POA: Diagnosis not present

## 2017-08-24 DIAGNOSIS — L658 Other specified nonscarring hair loss: Secondary | ICD-10-CM | POA: Diagnosis not present

## 2017-08-24 DIAGNOSIS — L304 Erythema intertrigo: Secondary | ICD-10-CM | POA: Diagnosis not present

## 2017-11-08 DIAGNOSIS — R350 Frequency of micturition: Secondary | ICD-10-CM | POA: Diagnosis not present

## 2017-11-08 DIAGNOSIS — Z1231 Encounter for screening mammogram for malignant neoplasm of breast: Secondary | ICD-10-CM | POA: Diagnosis not present

## 2017-11-08 DIAGNOSIS — Z01419 Encounter for gynecological examination (general) (routine) without abnormal findings: Secondary | ICD-10-CM | POA: Diagnosis not present

## 2017-11-08 DIAGNOSIS — Z6826 Body mass index (BMI) 26.0-26.9, adult: Secondary | ICD-10-CM | POA: Diagnosis not present

## 2017-11-08 DIAGNOSIS — M810 Age-related osteoporosis without current pathological fracture: Secondary | ICD-10-CM | POA: Diagnosis not present

## 2017-11-16 DIAGNOSIS — M25562 Pain in left knee: Secondary | ICD-10-CM | POA: Diagnosis not present

## 2018-02-15 DIAGNOSIS — Z1389 Encounter for screening for other disorder: Secondary | ICD-10-CM | POA: Diagnosis not present

## 2018-02-15 DIAGNOSIS — R946 Abnormal results of thyroid function studies: Secondary | ICD-10-CM | POA: Diagnosis not present

## 2018-02-15 DIAGNOSIS — M810 Age-related osteoporosis without current pathological fracture: Secondary | ICD-10-CM | POA: Diagnosis not present

## 2018-02-15 DIAGNOSIS — F341 Dysthymic disorder: Secondary | ICD-10-CM | POA: Diagnosis not present

## 2018-02-15 DIAGNOSIS — Z0000 Encounter for general adult medical examination without abnormal findings: Secondary | ICD-10-CM | POA: Diagnosis not present

## 2018-02-23 DIAGNOSIS — M810 Age-related osteoporosis without current pathological fracture: Secondary | ICD-10-CM | POA: Diagnosis not present

## 2018-02-23 DIAGNOSIS — Z0000 Encounter for general adult medical examination without abnormal findings: Secondary | ICD-10-CM | POA: Diagnosis not present

## 2018-02-23 DIAGNOSIS — E7800 Pure hypercholesterolemia, unspecified: Secondary | ICD-10-CM | POA: Diagnosis not present

## 2018-02-23 DIAGNOSIS — F341 Dysthymic disorder: Secondary | ICD-10-CM | POA: Diagnosis not present

## 2018-02-23 DIAGNOSIS — R946 Abnormal results of thyroid function studies: Secondary | ICD-10-CM | POA: Diagnosis not present

## 2018-02-23 DIAGNOSIS — Z131 Encounter for screening for diabetes mellitus: Secondary | ICD-10-CM | POA: Diagnosis not present

## 2018-03-24 DIAGNOSIS — Z23 Encounter for immunization: Secondary | ICD-10-CM | POA: Diagnosis not present

## 2018-05-18 DIAGNOSIS — N390 Urinary tract infection, site not specified: Secondary | ICD-10-CM | POA: Diagnosis not present

## 2018-07-20 DIAGNOSIS — J019 Acute sinusitis, unspecified: Secondary | ICD-10-CM | POA: Diagnosis not present

## 2018-07-31 DIAGNOSIS — N6310 Unspecified lump in the right breast, unspecified quadrant: Secondary | ICD-10-CM | POA: Diagnosis not present

## 2018-08-02 DIAGNOSIS — N6310 Unspecified lump in the right breast, unspecified quadrant: Secondary | ICD-10-CM | POA: Diagnosis not present

## 2018-08-02 DIAGNOSIS — N644 Mastodynia: Secondary | ICD-10-CM | POA: Diagnosis not present

## 2018-08-02 DIAGNOSIS — N6459 Other signs and symptoms in breast: Secondary | ICD-10-CM | POA: Diagnosis not present

## 2018-11-19 DIAGNOSIS — H6692 Otitis media, unspecified, left ear: Secondary | ICD-10-CM | POA: Diagnosis not present

## 2018-11-19 DIAGNOSIS — L03116 Cellulitis of left lower limb: Secondary | ICD-10-CM | POA: Diagnosis not present

## 2018-11-20 DIAGNOSIS — Z01419 Encounter for gynecological examination (general) (routine) without abnormal findings: Secondary | ICD-10-CM | POA: Diagnosis not present

## 2018-11-20 DIAGNOSIS — M810 Age-related osteoporosis without current pathological fracture: Secondary | ICD-10-CM | POA: Diagnosis not present

## 2018-12-21 DIAGNOSIS — M179 Osteoarthritis of knee, unspecified: Secondary | ICD-10-CM | POA: Diagnosis not present

## 2018-12-21 DIAGNOSIS — M545 Low back pain: Secondary | ICD-10-CM | POA: Diagnosis not present

## 2018-12-21 DIAGNOSIS — M25562 Pain in left knee: Secondary | ICD-10-CM | POA: Diagnosis not present

## 2018-12-21 DIAGNOSIS — M25561 Pain in right knee: Secondary | ICD-10-CM | POA: Diagnosis not present

## 2019-01-17 DIAGNOSIS — L814 Other melanin hyperpigmentation: Secondary | ICD-10-CM | POA: Diagnosis not present

## 2019-01-17 DIAGNOSIS — L218 Other seborrheic dermatitis: Secondary | ICD-10-CM | POA: Diagnosis not present

## 2019-01-17 DIAGNOSIS — L918 Other hypertrophic disorders of the skin: Secondary | ICD-10-CM | POA: Diagnosis not present

## 2019-01-17 DIAGNOSIS — L819 Disorder of pigmentation, unspecified: Secondary | ICD-10-CM | POA: Diagnosis not present

## 2019-01-17 DIAGNOSIS — L821 Other seborrheic keratosis: Secondary | ICD-10-CM | POA: Diagnosis not present

## 2019-01-17 DIAGNOSIS — D1801 Hemangioma of skin and subcutaneous tissue: Secondary | ICD-10-CM | POA: Diagnosis not present

## 2019-01-17 DIAGNOSIS — D229 Melanocytic nevi, unspecified: Secondary | ICD-10-CM | POA: Diagnosis not present

## 2019-01-25 DIAGNOSIS — M25562 Pain in left knee: Secondary | ICD-10-CM | POA: Diagnosis not present

## 2019-01-25 DIAGNOSIS — M170 Bilateral primary osteoarthritis of knee: Secondary | ICD-10-CM | POA: Diagnosis not present

## 2019-01-25 DIAGNOSIS — M25561 Pain in right knee: Secondary | ICD-10-CM | POA: Diagnosis not present

## 2019-03-01 DIAGNOSIS — L814 Other melanin hyperpigmentation: Secondary | ICD-10-CM | POA: Diagnosis not present

## 2019-03-01 DIAGNOSIS — L570 Actinic keratosis: Secondary | ICD-10-CM | POA: Diagnosis not present

## 2019-03-01 DIAGNOSIS — L209 Atopic dermatitis, unspecified: Secondary | ICD-10-CM | POA: Diagnosis not present

## 2019-03-01 DIAGNOSIS — L219 Seborrheic dermatitis, unspecified: Secondary | ICD-10-CM | POA: Diagnosis not present

## 2019-03-01 DIAGNOSIS — L821 Other seborrheic keratosis: Secondary | ICD-10-CM | POA: Diagnosis not present

## 2019-04-06 DIAGNOSIS — Z23 Encounter for immunization: Secondary | ICD-10-CM | POA: Diagnosis not present

## 2019-06-16 ENCOUNTER — Ambulatory Visit: Payer: Medicare Other | Attending: Internal Medicine

## 2019-06-16 DIAGNOSIS — Z23 Encounter for immunization: Secondary | ICD-10-CM

## 2019-06-16 NOTE — Progress Notes (Signed)
   Covid-19 Vaccination Clinic  Name:  Erin Sampson    MRN: [id number] DOB: May 08, 1946  06/16/2019  [name] was observed post Covid-19 immunization for 15 minutes without incidence. She was provided with Vaccine Information Sheet and instruction to access the V-Safe system.   [name] was instructed to call 911 with any severe reactions post vaccine: Marland Kitchen Difficulty breathing  . Swelling of your face and throat  . A fast heartbeat  . A bad rash all over your body  . Dizziness and weakness    Immunizations Administered    Name Date Dose VIS Date Route   Pfizer COVID-19 Vaccine 06/16/2019 12:15 PM 0.3 mL 05/11/2019 Intramuscular   Manufacturer: [employer], [employer]   Lot: [id number]   [place]: [id number]

## 2019-06-19 IMAGING — US US THYROID
1 series · 13 of 25 positions shown · non-contrast
Comparison: None.

CLINICAL DATA: Abnormal thyroid exam.  Question nodule.

EXAM:
THYROID ULTRASOUND
TECHNIQUE: Ultrasound examination of the thyroid gland and adjacent soft
tissues was performed.

[Series 1: us thyroid · 0.05mm/px · 13 of 63 slices shown]
[im 1/63]
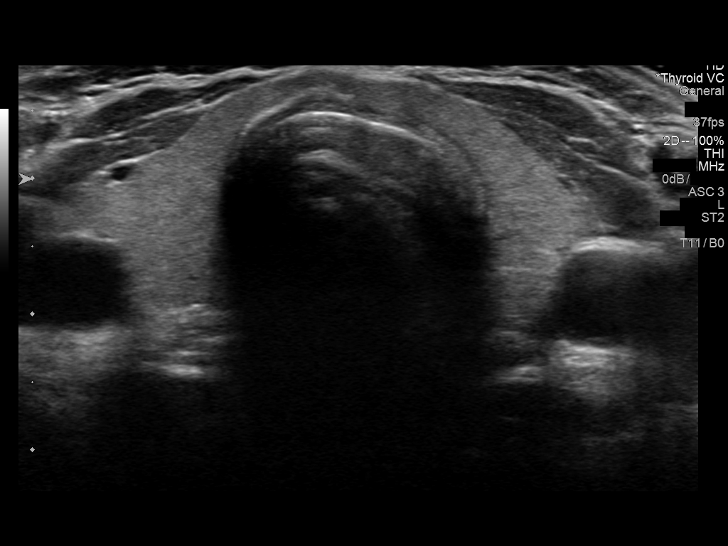
[im 6/63]
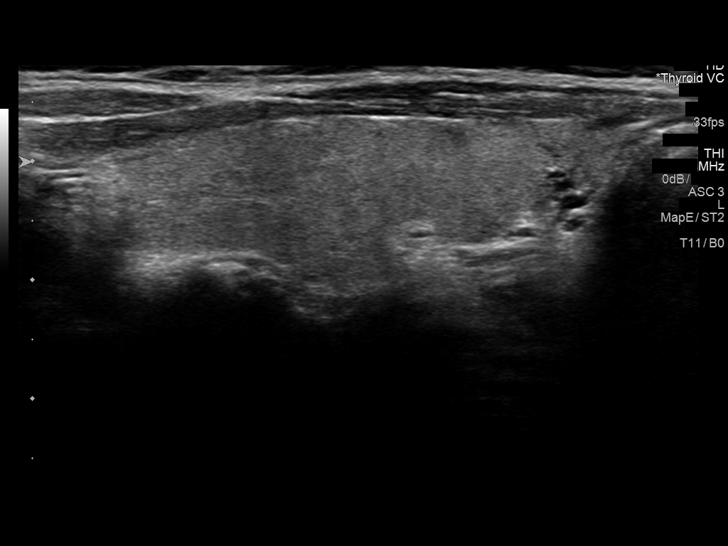
[im 11/63]
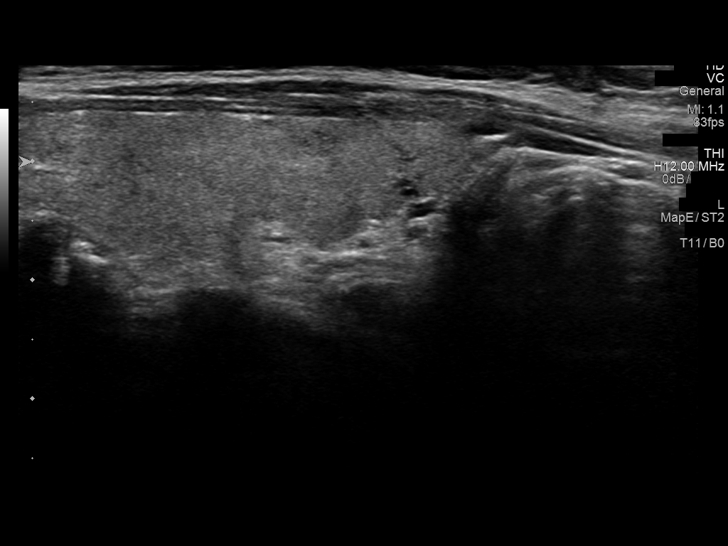
[im 16/63]
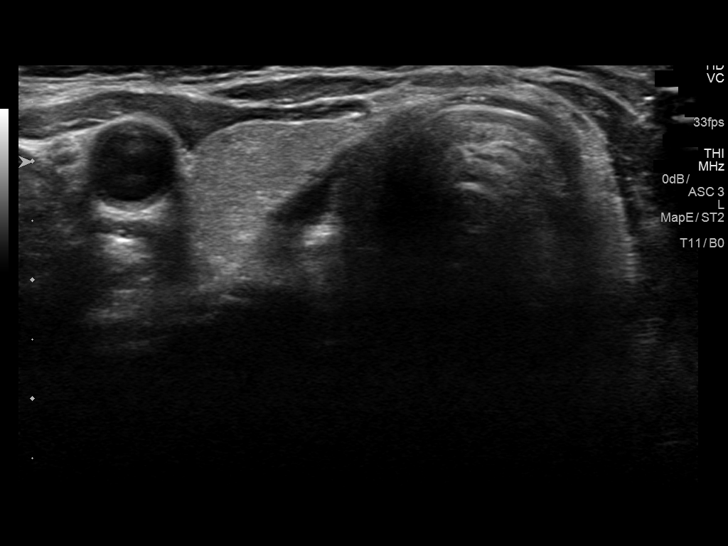
[im 21/63]
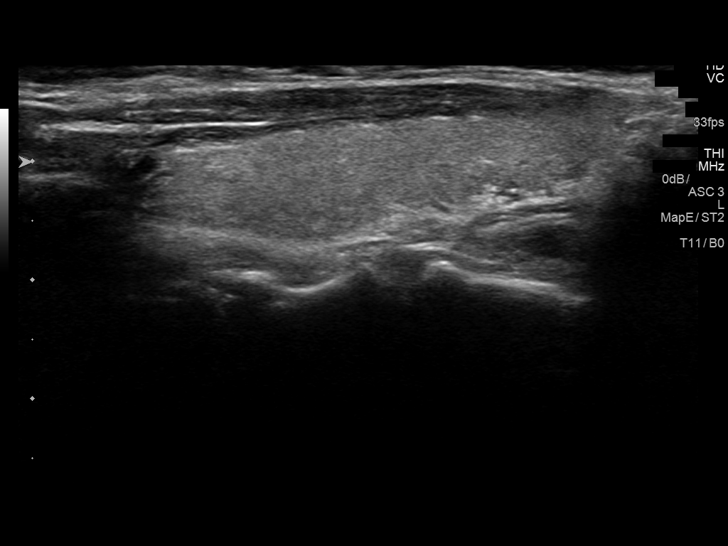
[im 26/63]
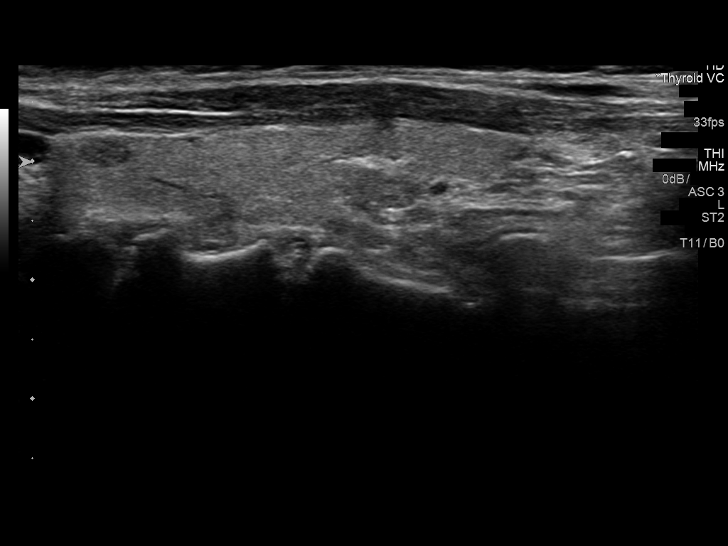
[im 32/63]
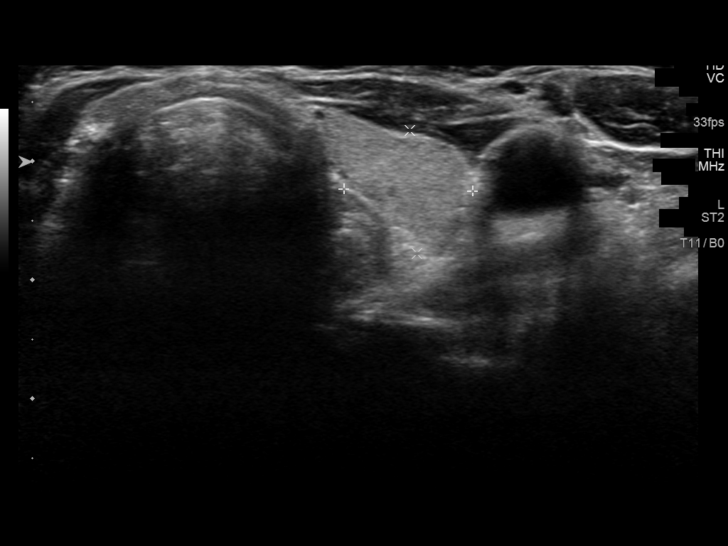
[im 37/63]
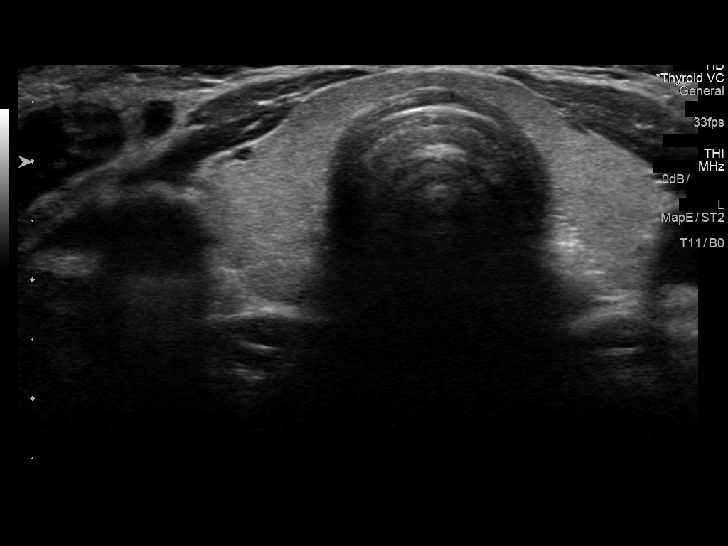
[im 42/63]
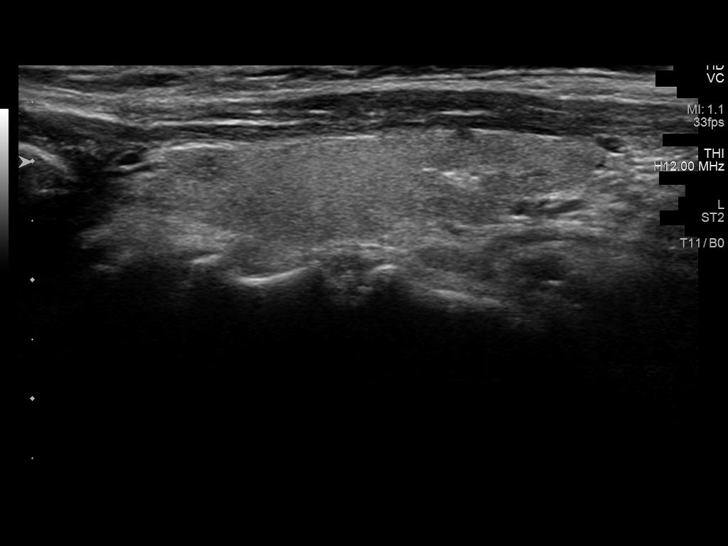
[im 47/63]
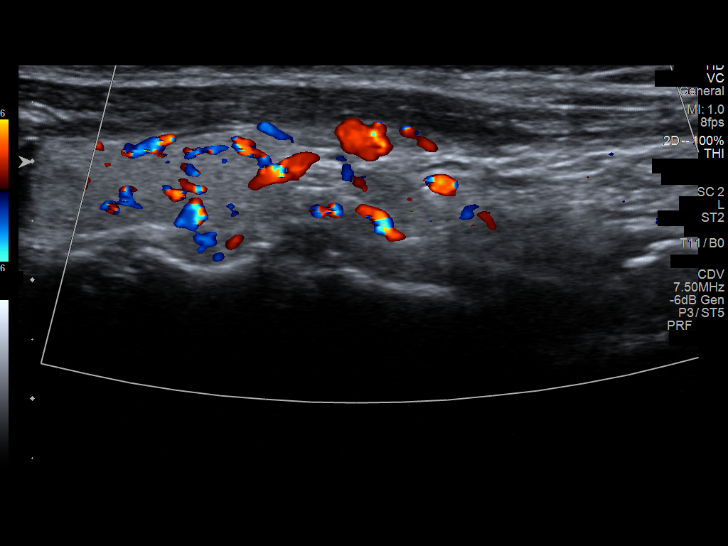
[im 52/63]
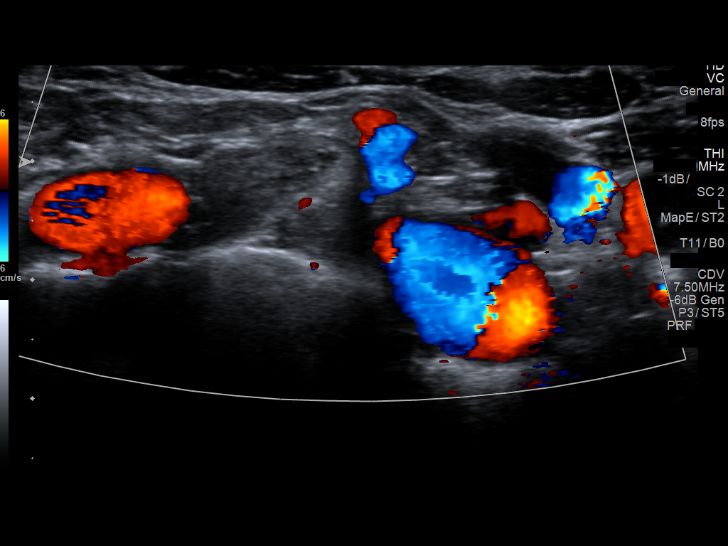
[im 57/63]
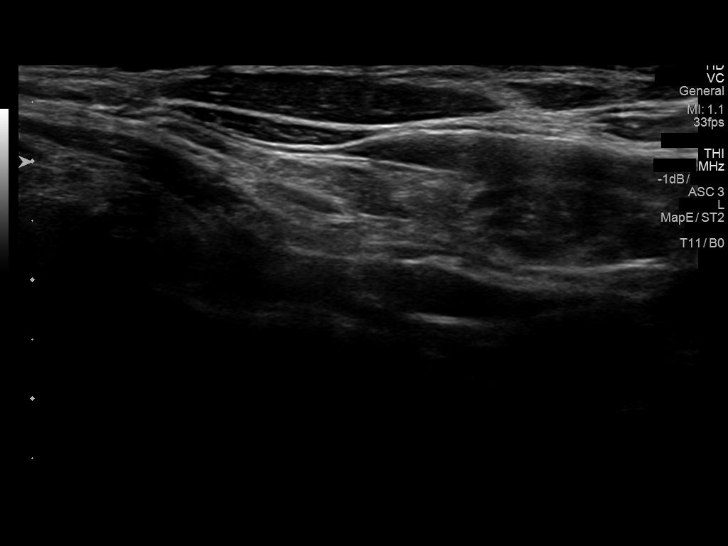
[im 63/63]
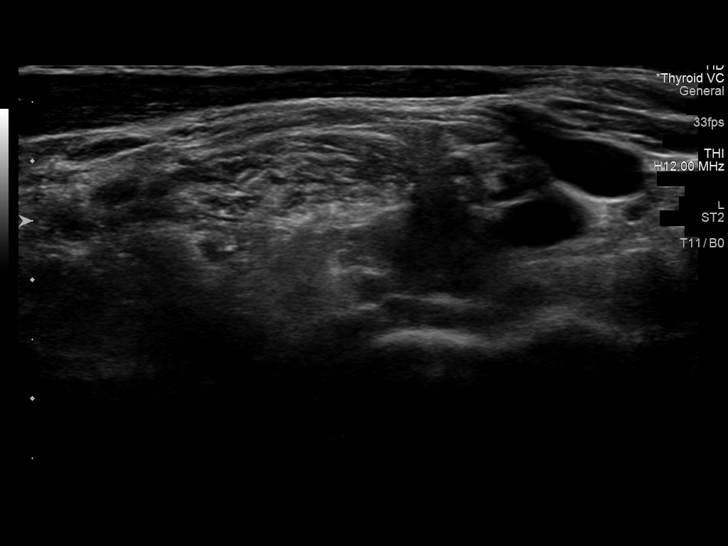

[13 of 25 positions shown; findings below may reference images not displayed]

FINDINGS: Parenchymal Echotexture: Normal

Isthmus: 0.3 cm

Right lobe: 4.6 x 1.4 x 1.5 cm

Left lobe: 4.1 x 1.0 x 1.1 cm

_________________________________________________________

Estimated total number of nodules >/= 1 cm: 0

Number of spongiform nodules >/=  2 cm not described below (TR1): 0

Number of mixed cystic and solid nodules >/= 1.5 cm not described
below (TR2): 0

_________________________________________________________

No discrete right thyroid nodules. Small hypoechoic nodule in the
superior left thyroid lobe measuring 0.5 x 0.3 x 0.3 cm.

No significant lymph node enlargement around the thyroid.
Heterogeneous structures were noted in the supraclavicular area
bilaterally and most likely related to musculature in these areas.
IMPRESSION: Thyroid tissue is essentially normal. There is a single tiny left
thyroid nodule that does not meet criteria for biopsy or dedicated
follow-up.

No significant lymph node enlargement around the thyroid.

The above is in keeping with the ACR TI-RADS recommendations - [HOSPITAL] 2727;[DATE].

## 2019-07-04 ENCOUNTER — Ambulatory Visit: Payer: Medicare Other

## 2019-07-04 ENCOUNTER — Ambulatory Visit: Payer: Medicare Other | Attending: Internal Medicine

## 2019-07-04 DIAGNOSIS — Z23 Encounter for immunization: Secondary | ICD-10-CM | POA: Insufficient documentation

## 2019-11-22 ENCOUNTER — Other Ambulatory Visit: Payer: Self-pay | Admitting: Family Medicine

## 2019-11-22 ENCOUNTER — Ambulatory Visit
Admission: RE | Admit: 2019-11-22 | Discharge: 2019-11-22 | Disposition: A | Discharge status: Home / Self Care | Payer: Medicare Other | Source: Ambulatory Visit | Attending: Family Medicine | Admitting: Family Medicine

## 2019-11-22 DIAGNOSIS — M1990 Unspecified osteoarthritis, unspecified site: Secondary | ICD-10-CM

## 2019-12-20 ENCOUNTER — Other Ambulatory Visit: Payer: Self-pay | Admitting: Family Medicine

## 2019-12-20 DIAGNOSIS — E041 Nontoxic single thyroid nodule: Secondary | ICD-10-CM

## 2020-01-07 ENCOUNTER — Other Ambulatory Visit: Payer: Medicare Other

## 2020-01-23 ENCOUNTER — Ambulatory Visit
Admission: RE | Admit: 2020-01-23 | Discharge: 2020-01-23 | Disposition: A | Discharge status: Home / Self Care | Payer: Medicare Other | Source: Ambulatory Visit | Attending: Family Medicine | Admitting: Family Medicine

## 2020-01-23 DIAGNOSIS — E041 Nontoxic single thyroid nodule: Secondary | ICD-10-CM

## 2020-01-25 ENCOUNTER — Emergency Department (HOSPITAL_BASED_OUTPATIENT_CLINIC_OR_DEPARTMENT_OTHER)
Admission: EM | Admit: 2020-01-25 | Discharge: 2020-01-25 | Disposition: A | Discharge status: Home / Self Care | Payer: Medicare Other | Attending: Emergency Medicine | Admitting: Emergency Medicine

## 2020-01-25 ENCOUNTER — Encounter (HOSPITAL_BASED_OUTPATIENT_CLINIC_OR_DEPARTMENT_OTHER): Payer: Self-pay | Admitting: Emergency Medicine

## 2020-01-25 DIAGNOSIS — R1031 Right lower quadrant pain: Secondary | ICD-10-CM | POA: Diagnosis not present

## 2020-01-25 DIAGNOSIS — Z5321 Procedure and treatment not carried out due to patient leaving prior to being seen by health care provider: Secondary | ICD-10-CM | POA: Diagnosis not present

## 2020-01-25 LAB — CBC
HCT: 40 % (ref 36.0–46.0)
Hemoglobin: 13.5 g/dL (ref 12.0–15.0)
MCH: 30.2 pg (ref 26.0–34.0)
MCHC: 33.8 g/dL (ref 30.0–36.0)
MCV: 89.5 fL (ref 80.0–100.0)
Platelets: 256 10*3/uL (ref 150–400)
RBC: 4.47 MIL/uL (ref 3.87–5.11)
RDW: 12.7 % (ref 11.5–15.5)
WBC: 6.4 10*3/uL (ref 4.0–10.5)
nRBC: 0 % (ref 0.0–0.2)

## 2020-01-25 LAB — COMPREHENSIVE METABOLIC PANEL
ALT: 18 U/L (ref 0–44)
AST: 22 U/L (ref 15–41)
Albumin: 4 g/dL (ref 3.5–5.0)
Alkaline Phosphatase: 48 U/L (ref 38–126)
Anion gap: 10 (ref 5–15)
BUN: 14 mg/dL (ref 8–23)
CO2: 24 mmol/L (ref 22–32)
Calcium: 8.9 mg/dL (ref 8.9–10.3)
Chloride: 99 mmol/L (ref 98–111)
Creatinine, Ser: 0.77 mg/dL (ref 0.44–1.00)
GFR calc Af Amer: >60 mL/min (ref >60)
GFR calc non Af Amer: >60 mL/min (ref >60)
Glucose, Bld: 153 mg/dL — ABNORMAL HIGH (ref 70–99)
Potassium: 3.5 mmol/L (ref 3.5–5.1)
Sodium: 133 mmol/L — ABNORMAL LOW (ref 135–145)
Total Bilirubin: 0.7 mg/dL (ref 0.3–1.2)
Total Protein: 6.9 g/dL (ref 6.5–8.1)

## 2020-01-25 LAB — URINALYSIS, ROUTINE W REFLEX MICROSCOPIC
Bilirubin Urine: NEGATIVE
Glucose, UA: NEGATIVE mg/dL
Hgb urine dipstick: NEGATIVE
Ketones, ur: 15 mg/dL — AB
Nitrite: NEGATIVE
Protein, ur: NEGATIVE mg/dL
Specific Gravity, Urine: 1.02 (ref 1.005–1.030)
pH: 6 (ref 5.0–8.0)

## 2020-01-25 LAB — LIPASE, BLOOD: Lipase: 40 U/L (ref 11–51)

## 2020-01-25 LAB — URINALYSIS, MICROSCOPIC (REFLEX)

## 2020-01-25 NOTE — ED Triage Notes (Signed)
Right flank pain radiating to RLQ for 2 days, denies injury.

## 2020-01-26 ENCOUNTER — Other Ambulatory Visit: Payer: Self-pay

## 2020-01-26 ENCOUNTER — Emergency Department (HOSPITAL_BASED_OUTPATIENT_CLINIC_OR_DEPARTMENT_OTHER)
Admission: EM | Admit: 2020-01-26 | Discharge: 2020-01-26 | Disposition: A | Discharge status: Home / Self Care | Payer: Medicare Other | Attending: Emergency Medicine | Admitting: Emergency Medicine

## 2020-01-26 ENCOUNTER — Encounter (HOSPITAL_BASED_OUTPATIENT_CLINIC_OR_DEPARTMENT_OTHER): Payer: Self-pay | Admitting: Emergency Medicine

## 2020-01-26 ENCOUNTER — Emergency Department (HOSPITAL_BASED_OUTPATIENT_CLINIC_OR_DEPARTMENT_OTHER): Payer: Medicare Other

## 2020-01-26 DIAGNOSIS — Z87891 Personal history of nicotine dependence: Secondary | ICD-10-CM | POA: Insufficient documentation

## 2020-01-26 DIAGNOSIS — Z7982 Long term (current) use of aspirin: Secondary | ICD-10-CM | POA: Insufficient documentation

## 2020-01-26 DIAGNOSIS — Z79899 Other long term (current) drug therapy: Secondary | ICD-10-CM | POA: Insufficient documentation

## 2020-01-26 DIAGNOSIS — M545 Low back pain: Secondary | ICD-10-CM | POA: Insufficient documentation

## 2020-01-26 DIAGNOSIS — R109 Unspecified abdominal pain: Secondary | ICD-10-CM | POA: Insufficient documentation

## 2020-01-26 DIAGNOSIS — K5792 Diverticulitis of intestine, part unspecified, without perforation or abscess without bleeding: Secondary | ICD-10-CM | POA: Insufficient documentation

## 2020-01-26 DIAGNOSIS — M549 Dorsalgia, unspecified: Secondary | ICD-10-CM

## 2020-01-26 LAB — URINALYSIS, MICROSCOPIC (REFLEX): RBC / HPF: NONE SEEN RBC/hpf (ref 0–5)

## 2020-01-26 LAB — URINALYSIS, ROUTINE W REFLEX MICROSCOPIC
Bilirubin Urine: NEGATIVE
Glucose, UA: NEGATIVE mg/dL
Hgb urine dipstick: NEGATIVE
Ketones, ur: NEGATIVE mg/dL
Nitrite: NEGATIVE
Protein, ur: NEGATIVE mg/dL
Specific Gravity, Urine: 1.025 (ref 1.005–1.030)
pH: 6 (ref 5.0–8.0)

## 2020-01-26 MED ORDER — KETOROLAC TROMETHAMINE 60 MG/2ML IM SOLN
30.0000 mg | Freq: Once | INTRAMUSCULAR | Status: AC
  Administered 2020-01-26: 13:00:00 30 mg via INTRAMUSCULAR
  Filled 2020-01-26: qty 2

## 2020-01-26 NOTE — ED Provider Notes (Signed)
[name] Provider Note   CSN: [id number] Arrival date & time: 01/26/20  1101     History Chief Complaint  Patient presents with  . Flank Pain    Erin Sampson is a 74 y.o. female.  Presents to ER with concern for right low back pain, right side pain occurring for the last 3 days.  Dull achy, intermittent, seems to be worse with certain movements.  Has taken a couple Motrin with some improvement.  No fevers, no dysuria, hematuria, vomiting.  Has history of appendectomy.  HPI     Past Medical History:  Diagnosis Date  . Bursitis   . Diverticulitis   . IBS (irritable bowel syndrome)     Patient Active Problem List   Diagnosis Date Noted  . TIA (transient ischemic attack) 08/27/2013    Past Surgical History:  Procedure Laterality Date  . ABDOMINAL HYSTERECTOMY    . APPENDECTOMY       OB History   No obstetric history on file.     No family history on file.  Social History   Tobacco Use  . Smoking status: Former Research scientist (life sciences)  . Smokeless tobacco: Never Used  Substance Use Topics  . Alcohol use: No  . Drug use: No    Home Medications Prior to Admission medications   Medication Sig Start Date End Date Taking? Authorizing Provider  aspirin 81 MG tablet Take 325 mg by mouth daily.     [provider]  calcium carbonate (OS-CAL) 600 MG TABS tablet Take 600 mg by mouth 2 (two) times daily with a meal.    [provider]  Cetirizine HCl 10 MG CAPS Take by mouth.    [provider]  cholecalciferol (VITAMIN D) 1000 UNITS tablet Take 3,000 Units by mouth daily.    [provider]    Allergies    Codeine  Review of Systems   Review of Systems  Physical Exam Updated Vital Signs BP (!) 142/68 (BP Location: Left Arm)   Pulse 87   Temp 99 F (37.2 C) (Oral)   Resp 16   Ht 5\' 4"  (1.626 m)   Wt 70.3 kg   SpO2 98%   BMI 26.61 kg/m   Physical Exam Vitals and nursing note reviewed.    Constitutional:      General: She is not in acute distress.    Appearance: She is well-developed.  HENT:     Head: Normocephalic and atraumatic.  Eyes:     Conjunctiva/sclera: Conjunctivae normal.  Cardiovascular:     Rate and Rhythm: Normal rate and regular rhythm.     Heart sounds: No murmur heard.   Pulmonary:     Effort: Pulmonary effort is normal. No respiratory distress.     Breath sounds: Normal breath sounds.  Abdominal:     Palpations: Abdomen is soft.     Tenderness: There is no abdominal tenderness.  Musculoskeletal:     Cervical back: Neck supple.  Skin:    General: Skin is warm and dry.  Neurological:     General: No focal deficit present.     Mental Status: She is alert.     ED Results / Procedures / Treatments   Labs (all labs ordered are listed, but only abnormal results are displayed) Labs Reviewed  URINALYSIS, ROUTINE W REFLEX MICROSCOPIC - Abnormal; Notable for the following components:      Result Value   Leukocytes,Ua TRACE (*)    All other components within  normal limits  URINALYSIS, MICROSCOPIC (REFLEX) - Abnormal; Notable for the following components:   Bacteria, UA FEW (*)    All other components within normal limits    EKG None  Radiology CT Renal Stone Study  Result Date: 01/26/2020 CLINICAL DATA:  Right flank pain and lower back pain, abdominal pain Hysterectomy,appy EXAM: CT ABDOMEN AND PELVIS WITHOUT CONTRAST TECHNIQUE: Multidetector CT imaging of the abdomen and pelvis was performed following the standard protocol without IV contrast. COMPARISON:  None. FINDINGS: Lower chest: No acute abnormality. Hepatobiliary: No focal liver abnormality is seen. No gallstones or gallbladder wall thickening. Pancreas: Unremarkable. No surrounding inflammatory changes. Spleen: Normal in size without focal abnormality. Adrenals/Urinary Tract: Adrenal glands are unremarkable. No renal calculi. There is mild fullness of the proximal right renal collecting  system. No left hydronephrosis. No large renal masses. Unremarkable appearance of the urinary bladder. Stomach/Bowel: Stomach is within normal limits. No evidence of bowel wall thickening, distention, or inflammatory changes. Colonic diverticula without evidence of diverticulitis. Vascular/Lymphatic: Aortic atherosclerosis. Vascular patency cannot be assessed in the absence of IV contrast no enlarged abdominal or pelvic lymph nodes. Reproductive: Status post hysterectomy. No adnexal masses. Other: No abdominal wall hernia or abnormality. No abdominopelvic ascites. Musculoskeletal: Likely chronic height loss at L3 and L4. Lower lumbar facet hypertrophy. IMPRESSION: 1. There is mild fullness of the proximal right renal collecting system. No renal calculi are identified. Findings may represent a recently passed stone. 2. Colonic diverticulosis without evidence of diverticulitis. 3. Likely chronic height loss at the L3 and L4 vertebral bodies. 4. Aortic atherosclerosis. Aortic Atherosclerosis (ICD10-I70.0). Electronically Signed   By: Audie Pinto M.D.   On: 01/26/2020 13:02    Procedures Procedures (including critical care time)  Medications Ordered in ED Medications  ketorolac (TORADOL) injection 30 mg (30 mg Intramuscular Given 01/26/20 1329)    ED Course  I have reviewed the triage vital signs and the nursing notes.  Pertinent labs & imaging results that were available during my care of the patient were reviewed by me and considered in my medical decision making (see chart for details).    MDM Rules/Calculators/A&P                          [age]-year-old lady presents to ER with concern for right low back, flank pain.  On exam patient is remarkably well-appearing, her symptoms completely resolved after single dose of Toradol.  Lab work from yesterday is grossly normal.  CT scan demonstrated mild fullness of proximal right renal collecting system which could be consistent with recently passed  stone.  Suspect this is a possibility, alternative diagnosis considered would be MSK strain.  Given her pain has resolved and she has no fever, no UTI, no further treatment would be needed if it is a kidney stone.  I recommend patient follow-up with her primary doctor, reviewed return precautions and discharged home.    After the discussed management above, the patient was determined to be safe for discharge.  The patient was in agreement with this plan and all questions regarding their care were answered.  ED return precautions were discussed and the patient will return to the ED with any significant worsening of condition.   Final Clinical Impression(s) / ED Diagnoses Final diagnoses:  Right flank pain  Back pain, unspecified back location, unspecified back pain laterality, unspecified chronicity    Rx / DC Orders ED Discharge Orders    None  Lucrezia Starch, MD 01/27/20 [phone]

## 2020-01-26 NOTE — ED Triage Notes (Addendum)
R low back pain radiating into abd x 3 days. Was seen yesterday but left before being seen. She had labs done. Pt requesting another UA be done because the one from yesterday had an incorrect label.

## 2020-01-26 NOTE — Discharge Instructions (Signed)
Recommend Tylenol Motrin as needed for pain control.  If you develop worsening pain, vomiting, fever, difficulty walking or other new concerning symptom, return to ER for reassessment.

## 2020-01-30 ENCOUNTER — Other Ambulatory Visit: Payer: Medicare Other

## 2020-04-18 ENCOUNTER — Ambulatory Visit: Payer: Medicare Other | Attending: Internal Medicine

## 2020-04-18 DIAGNOSIS — Z23 Encounter for immunization: Secondary | ICD-10-CM

## 2020-04-18 NOTE — Progress Notes (Signed)
° °  Covid-19 Vaccination Clinic  Name:  ALLIANNA BEAUBIEN    MRN: [id number] DOB: March 18, 1946  04/18/2020  Ms. Andujar was observed post Covid-19 immunization for 15 minutes without incident. She was provided with Vaccine Information Sheet and instruction to access the V-Safe system.   Ms. Bowdish was instructed to call 911 with any severe reactions post vaccine:  Difficulty breathing   Swelling of face and throat   A fast heartbeat   A bad rash all over body   Dizziness and weakness   Immunizations Administered    Name Date Dose VIS Date Route   Pfizer COVID-19 Vaccine 04/18/2020 11:26 AM 0.3 mL 03/19/2020 Intramuscular   Manufacturer: [place]   Lot: [id number]   [place]: [id number]

## 2020-05-09 ENCOUNTER — Telehealth: Payer: Self-pay | Admitting: Medical

## 2020-05-09 NOTE — Telephone Encounter (Signed)
No records received yet

## 2020-05-09 NOTE — Telephone Encounter (Signed)
Patient would like to know if you have received her records from [facility]

## 2020-06-03 ENCOUNTER — Ambulatory Visit: Payer: Medicare Other | Admitting: Medical

## 2020-08-12 ENCOUNTER — Other Ambulatory Visit: Payer: Self-pay

## 2020-08-12 ENCOUNTER — Encounter: Payer: Self-pay | Admitting: Medical

## 2020-08-12 ENCOUNTER — Ambulatory Visit (INDEPENDENT_AMBULATORY_CARE_PROVIDER_SITE_OTHER): Payer: Medicare Other | Admitting: Medical

## 2020-08-12 VITALS — BP 125/58 | HR 71 | Temp 98.5°F | Resp 18 | Ht 64.0 in | Wt 157.0 lb

## 2020-08-12 DIAGNOSIS — Z1159 Encounter for screening for other viral diseases: Secondary | ICD-10-CM

## 2020-08-12 DIAGNOSIS — Z8659 Personal history of other mental and behavioral disorders: Secondary | ICD-10-CM

## 2020-08-12 DIAGNOSIS — M810 Age-related osteoporosis without current pathological fracture: Secondary | ICD-10-CM

## 2020-08-12 DIAGNOSIS — Z1211 Encounter for screening for malignant neoplasm of colon: Secondary | ICD-10-CM

## 2020-08-12 DIAGNOSIS — Z8673 Personal history of transient ischemic attack (TIA), and cerebral infarction without residual deficits: Secondary | ICD-10-CM

## 2020-08-12 DIAGNOSIS — E785 Hyperlipidemia, unspecified: Secondary | ICD-10-CM | POA: Diagnosis not present

## 2020-08-12 NOTE — Patient Instructions (Addendum)
Hx of tia 4 years ago and mild high lipid in past. Will get cmp and lipid panel today.  For mild low blood pressure will include cbc as well. See if any degree of anemia.  Hx of depression. Resolved for years.  Hx of osteoporosis. Continue vit d and calcium. Repeat dexascan 11-2020. Consider use of alondranate/fosamax. Discussed benefit vs risk.  Placed referral for screening colonoscopy.  Follow up in 2 months or as needed   Osteoporosis  Osteoporosis is when the bones get thin and weak. This can cause your bones to break (fracture) more easily. What are the causes? The exact cause of this condition is not known. What increases the risk?  Having family members with this condition.  Not eating enough healthy foods.  Taking certain medicines.  Being female.  Being age 38 or older.  Smoking or using other products that contain nicotine or tobacco, such as e-cigarettes or chewing tobacco.  Not exercising.  Being of European or Asian ancestry.  Having a small body frame. What are the signs or symptoms? A broken bone might be the first sign, especially if the break results from a fall or injury that usually would not cause a bone to break. Other signs and symptoms include:  Pain in the neck or low back.  Being hunched over (stooped posture).  Getting shorter. How is this treated?  Eating more foods with more calcium and vitamin D in them.  Doing exercises.  Stopping tobacco use.  Limiting how much alcohol you drink.  Taking medicines to slow bone loss or help make the bones stronger.  Taking supplements of calcium and vitamin D every day.  Taking medicines to replace chemicals in the body (hormone replacement medicines).  Monitoring your levels of calcium and vitamin D. The goal of treatment is to strengthen your bones and lower your risk for a bone break. Follow these instructions at home: Eating and drinking Eat plenty of calcium and vitamin D. These  nutrients are good for your bones. Good sources of calcium and vitamin D include:  Some fish, such as salmon and tuna.  Foods that have calcium and vitamin D added to them (fortified foods), such as some breakfast cereals.  Egg yolks.  Cheese.  Liver.   Activity Do exercises as told by your doctor. Ask your doctor what exercises are safe for you. You should do:  Exercises that make your muscles work to hold your body weight up (weight-bearing exercises). These include tai chi, yoga, and walking.  Exercises to make your muscles stronger. One example is lifting weights. Lifestyle  Do not drink alcohol if: ? Your doctor tells you not to drink. ? You are pregnant, may be pregnant, or are planning to become pregnant.  If you drink alcohol: ? Limit how much you use to:  0-1 drink a day for women.  0-2 drinks a day for men.  Know how much alcohol is in your drink. In the [place] one drink equals one 12 oz bottle of beer (355 mL), one 5 oz glass of wine (148 mL), or one 1 oz glass of hard liquor (44 mL).  Do not smoke or use any products that contain nicotine or tobacco. If you need help quitting, ask your doctor. Preventing falls  Use tools to help you move around (mobility aids) as needed. These include canes, walkers, scooters, and crutches.  Keep rooms well-lit.  Put away things on the floor that could make you trip. These include cords and  rugs.  Install safety rails on stairs. Install grab bars in bathrooms.  Use rubber mats in slippery areas, like bathrooms.  Wear shoes that: ? Fit you well. ? Support your feet. ? Have closed toes. ? Have rubber soles or low heels.  Tell your doctor about all of the medicines you are taking. Some medicines can make you more likely to fall. General instructions  Take over-the-counter and prescription medicines only as told by your doctor.  Keep all follow-up visits. Contact a doctor if:  You have not been tested (screened) for  osteoporosis and you are: ? A woman who is age 14 or older. ? A man who is age 55 or older. Get help right away if:  You fall.  You get hurt. Summary  Osteoporosis happens when your bones get thin and weak.  Weak bones can break (fracture) more easily.  Eat plenty of calcium and vitamin D. These are good for your bones.  Tell your doctor about all of the medicines that you take. This information is not intended to replace advice given to you by your health care provider. Make sure you discuss any questions you have with your health care provider. Document Revised: 11/01/2019 Document Reviewed: 11/01/2019 Elsevier Patient Education  [place].

## 2020-08-12 NOTE — Progress Notes (Signed)
Subjective:    Patient ID: Erin Sampson, female    DOB: 08-27-1945, 75 y.o.   MRN: [id number]  HPI  Pt in for first time.  Pt not on any prescription medications.  Seasonal allergies- worse in spring but period all year. Takes claritin when has.  Remote hx depression about 12 years ago. Pt states was on serzone for about 10 years. Was on prior to year 2000.    Pt states hx of thyroid nodule in the past. Left side thyroid nodule. Does not meet criteria for dedcated follow up or biopsy.  Osteoporosis. Last dexascan 11-20-2018. No history of fractures. Pt is on vit D and also taking calcium daily.  Mild hyperlipidemia in past but no meds.  Hx of TIA about 4 years ago. Brief episode where she could not speak for 20 minutes(had very slurred speech and pharmacist did not understand her). Also had problems processing reading. Also could not write well that day.  Work up was negative and told had TIA.  Pt tells me next day imaging studies were negative. No recurrent episodes.    Review of Systems  Constitutional: Negative for chills, fatigue and fever.  Respiratory: Negative for cough, chest tightness, shortness of breath and wheezing.   Cardiovascular: Negative for chest pain and palpitations.  Gastrointestinal: Negative for abdominal pain, blood in stool, constipation and nausea.  Musculoskeletal: Negative for back pain.  Hematological: Negative for adenopathy. Does not bruise/bleed easily.  Psychiatric/Behavioral: Negative for behavioral problems and confusion.    Past Medical History:  Diagnosis Date  . Allergy   . Bursitis   . Depression   . Diverticulitis   . IBS (irritable bowel syndrome)      Social History   Socioeconomic History  . Marital status: Married    Spouse name: Not on file  . Number of children: Not on file  . Years of education: Not on file  . Highest education level: Not on file  Occupational History  . Occupation: retired  Tobacco Use  .  Smoking status: Former Smoker    Packs/day: 1.00    Years: 20.00    Pack years: 20.00    Types: Cigarettes    Quit date: 03/16/1983    Years since quitting: 37.4  . Smokeless tobacco: Never Used  Vaping Use  . Vaping Use: Never used  Substance and Sexual Activity  . Alcohol use: No  . Drug use: No  . Sexual activity: Not Currently  Other Topics Concern  . Not on file  Social History Narrative   Married, with 2 children   2 yrs of college   Right handed   3-4 cups daily   Social Determinants of Health   Financial Resource Strain: Not on file  Food Insecurity: Not on file  Transportation Needs: Not on file  Physical Activity: Not on file  Stress: Not on file  Social Connections: Not on file  Intimate Partner Violence: Not on file    Past Surgical History:  Procedure Laterality Date  . ABDOMINAL HYSTERECTOMY    . APPENDECTOMY      History reviewed. No pertinent family history.  Allergies  Allergen Reactions  . Codeine Itching    Current Outpatient Medications on File Prior to Visit  Medication Sig Dispense Refill  . B-Complex TABS See admin instructions.    Marland Kitchen aspirin 81 MG tablet Take 325 mg by mouth daily.  (Patient not taking: Reported on 08/12/2020)    . Biotin 5000 MCG CAPS 1  tablet    . calcium carbonate (OS-CAL) 600 MG TABS tablet Take 600 mg by mouth 2 (two) times daily with a meal.    . Cetirizine HCl 10 MG CAPS Take by mouth.    . cholecalciferol (VITAMIN D) 1000 UNITS tablet Take 3,000 Units by mouth daily.    . Misc Natural Products ([place]) CAPS See admin instructions.     No current facility-administered medications on file prior to visit.    BP (!) 120/49   Pulse 71   Temp 98.5 F (36.9 C)   Resp 18   Ht 5\' 4"  (1.626 m)   Wt 157 lb (71.2 kg)   SpO2 99%   BMI 26.95 kg/m       Objective:   Physical Exam  General Mental Status- Alert. General Appearance- Not in acute distress.   Skin General: Color- Normal Color.  Moisture- Normal Moisture.  Neck Carotid Arteries- Normal color. Moisture- Normal Moisture. No carotid bruits. No JVD.  Chest and Lung Exam Auscultation: Breath Sounds:-Normal.  Cardiovascular Auscultation:Rythm- Regular. Murmurs & Other Heart Sounds:Auscultation of the heart reveals- No Murmurs.  Abdomen Inspection:-Inspeection Normal. Palpation/Percussion:Note:No mass. Palpation and Percussion of the abdomen reveal- Non Tender, Non Distended + BS, no rebound or guarding.   Neurologic Cranial Nerve exam:- CN III-XII intact(No nystagmus), symmetric smile. Drift Test:- No drift. Finger to Nose:- Normal/Intact Strength:- 5/5 equal and symmetric strength both upper and lower extremities.      Assessment & Plan:  Hx of tia 4 years ago and mild high lipid in past. Will get cmp and lipid panel today.  For mild low blood pressure will include cbc as well. See if any degree of anemia.  Hx of depression. Resolved for years.  Hx of osteoporosis. Continue vit d and calcium. Repeat dexascan 11-2020. Consider use of alondranate/fosamax. Discussed benefit vs risk.  Placed referral for screening colonoscopy.  Follow up in 2 months or as needed  Mackie Pai, PA-C   Time spent with patient today was 45  minutes which consisted of chart review/records from [facility], discussing diagnosis, work up treatment and documentation.

## 2020-08-13 LAB — CBC WITH DIFFERENTIAL/PLATELET
Basophils Absolute: 0.1 10*3/uL (ref 0.0–0.1)
Basophils Relative: 1.2 % (ref 0.0–3.0)
Eosinophils Absolute: 0.1 10*3/uL (ref 0.0–0.7)
Eosinophils Relative: 1.8 % (ref 0.0–5.0)
HCT: 40.2 % (ref 36.0–46.0)
Hemoglobin: 13.5 g/dL (ref 12.0–15.0)
Lymphocytes Relative: 34.2 % (ref 12.0–46.0)
Lymphs Abs: 2 10*3/uL (ref 0.7–4.0)
MCHC: 33.6 g/dL (ref 30.0–36.0)
MCV: 88.5 fl (ref 78.0–100.0)
Monocytes Absolute: 0.5 10*3/uL (ref 0.1–1.0)
Monocytes Relative: 8.2 % (ref 3.0–12.0)
Neutro Abs: 3.2 10*3/uL (ref 1.4–7.7)
Neutrophils Relative %: 54.6 % (ref 43.0–77.0)
Platelets: 267 10*3/uL (ref 150.0–400.0)
RBC: 4.54 Mil/uL (ref 3.87–5.11)
RDW: 13.5 % (ref 11.5–15.5)
WBC: 5.8 10*3/uL (ref 4.0–10.5)

## 2020-08-13 LAB — COMPREHENSIVE METABOLIC PANEL
ALT: 13 U/L (ref 0–35)
AST: 14 U/L (ref 0–37)
Albumin: 4.1 g/dL (ref 3.5–5.2)
Alkaline Phosphatase: 59 U/L (ref 39–117)
BUN: 18 mg/dL (ref 6–23)
CO2: 31 mEq/L (ref 19–32)
Calcium: 9.8 mg/dL (ref 8.4–10.5)
Chloride: 100 mEq/L (ref 96–112)
Creatinine, Ser: 0.77 mg/dL (ref 0.40–1.20)
GFR: 74.94 mL/min (ref >60.00)
Glucose, Bld: 92 mg/dL (ref 70–99)
Potassium: 4.4 mEq/L (ref 3.5–5.1)
Sodium: 139 mEq/L (ref 135–145)
Total Bilirubin: 0.5 mg/dL (ref 0.2–1.2)
Total Protein: 6.7 g/dL (ref 6.0–8.3)

## 2020-08-13 LAB — LIPID PANEL
Cholesterol: 217 mg/dL — ABNORMAL HIGH (ref 0–200)
HDL: 80.4 mg/dL (ref >39.00)
LDL Cholesterol: 120 mg/dL — ABNORMAL HIGH (ref 0–99)
NonHDL: 136.71
Total CHOL/HDL Ratio: 3
Triglycerides: 85 mg/dL (ref 0.0–149.0)
VLDL: 17 mg/dL (ref 0.0–40.0)

## 2020-08-13 LAB — HEPATITIS C ANTIBODY
Hepatitis C Ab: NONREACTIVE
SIGNAL TO CUT-OFF: 0.01 (ref <1.00)

## 2020-08-21 ENCOUNTER — Encounter: Payer: Medicare Other | Admitting: Family Medicine

## 2020-08-28 LAB — HM MAMMOGRAPHY

## 2020-08-28 LAB — HM DEXA SCAN

## 2020-09-30 DIAGNOSIS — L649 Androgenic alopecia, unspecified: Secondary | ICD-10-CM | POA: Diagnosis not present

## 2020-09-30 DIAGNOSIS — L219 Seborrheic dermatitis, unspecified: Secondary | ICD-10-CM | POA: Diagnosis not present

## 2020-09-30 DIAGNOSIS — D1801 Hemangioma of skin and subcutaneous tissue: Secondary | ICD-10-CM | POA: Diagnosis not present

## 2020-09-30 DIAGNOSIS — L821 Other seborrheic keratosis: Secondary | ICD-10-CM | POA: Diagnosis not present

## 2020-10-22 DIAGNOSIS — H43822 Vitreomacular adhesion, left eye: Secondary | ICD-10-CM | POA: Diagnosis not present

## 2020-10-22 DIAGNOSIS — H31103 Choroidal degeneration, unspecified, bilateral: Secondary | ICD-10-CM | POA: Diagnosis not present

## 2020-10-22 DIAGNOSIS — H2513 Age-related nuclear cataract, bilateral: Secondary | ICD-10-CM | POA: Diagnosis not present

## 2020-10-22 DIAGNOSIS — H04123 Dry eye syndrome of bilateral lacrimal glands: Secondary | ICD-10-CM | POA: Diagnosis not present

## 2020-10-22 DIAGNOSIS — H1045 Other chronic allergic conjunctivitis: Secondary | ICD-10-CM | POA: Diagnosis not present

## 2020-11-21 ENCOUNTER — Other Ambulatory Visit: Payer: Self-pay

## 2020-11-21 ENCOUNTER — Ambulatory Visit (INDEPENDENT_AMBULATORY_CARE_PROVIDER_SITE_OTHER): Payer: Medicare Other | Admitting: Medical

## 2020-11-21 VITALS — BP 115/73 | HR 80 | Temp 98.2°F | Resp 18 | Ht 64.0 in | Wt 155.6 lb

## 2020-11-21 DIAGNOSIS — M810 Age-related osteoporosis without current pathological fracture: Secondary | ICD-10-CM

## 2020-11-21 DIAGNOSIS — Z1152 Encounter for screening for COVID-19: Secondary | ICD-10-CM

## 2020-11-21 DIAGNOSIS — R059 Cough, unspecified: Secondary | ICD-10-CM | POA: Diagnosis not present

## 2020-11-21 DIAGNOSIS — R5383 Other fatigue: Secondary | ICD-10-CM | POA: Diagnosis not present

## 2020-11-21 MED ORDER — FLUTICASONE PROPIONATE 50 MCG/ACT NA SUSP: 2.0000 | Freq: Every day | NASAL | 1 refills | Status: DC

## 2020-11-21 MED ORDER — AZITHROMYCIN 250 MG PO TABS: ORAL_TABLET | ORAL | 0 refills | Status: AC

## 2020-11-21 MED ORDER — BENZONATATE 100 MG PO CAPS: 100.0000 mg | ORAL_CAPSULE | Freq: Three times a day (TID) | ORAL | 0 refills | Status: DC | PRN

## 2020-11-21 NOTE — Patient Instructions (Signed)
Uri type/faint st presently. Though wed symptoms were worse. Sounds like improving.  At this point will do send out covid as you and husband are sick and would be beneficial to know if you are positive though improving.  Flonase for nasal congestion. Benzonatate for cough.  If signs/symptoms worsen as discussed start azithromycin. Presently improving so may not need. Rx antibiotic sent to pharmacy/made available.  Follow up 7-10 days or as needed

## 2020-11-21 NOTE — Progress Notes (Signed)
Subjective:    Patient ID: Erin Sampson, female    DOB: 01-18-1946, 75 y.o.   MRN: [id number]  HPI  Pt had sinus tenderness, st and fatigue. Some better today but still very fatigued.  Pt states has mild  rare cough that is not productive.  Pt has been vaccinated and boosted against covid.  Husband is sick as well. Yesterday her rapid  covid test was negative.   Review of Systems  Constitutional:  Positive for fatigue. Negative for chills and fever.  HENT:  Positive for congestion. Negative for sinus pressure and sinus pain.        No sinus pressure today.  St feels better.  Respiratory:  Positive for cough. Negative for choking and wheezing.   Cardiovascular:  Negative for chest pain and palpitations.  Gastrointestinal:  Negative for abdominal pain.  Genitourinary:  Negative for difficulty urinating, dysuria and flank pain.  Musculoskeletal:  Negative for back pain and myalgias.     Past Medical History:  Diagnosis Date   Allergy    Bursitis    Depression    Diverticulitis    IBS (irritable bowel syndrome)      Social History   Socioeconomic History   Marital status: Married    Spouse name: Not on file   Number of children: Not on file   Years of education: Not on file   Highest education level: Not on file  Occupational History   Occupation: retired  Tobacco Use   Smoking status: Former    Packs/day: 1.00    Years: 20.00    Pack years: 20.00    Types: Cigarettes    Quit date: 03/16/1983    Years since quitting: 37.7   Smokeless tobacco: Never  Vaping Use   Vaping Use: Never used  Substance and Sexual Activity   Alcohol use: No   Drug use: No   Sexual activity: Not Currently  Other Topics Concern   Not on file  Social History Narrative   Married, with 2 children   2 yrs of college   Right handed   3-4 cups daily   Social Determinants of Health   Financial Resource Strain: Not on file  Food Insecurity: Not on file  Transportation  Needs: Not on file  Physical Activity: Not on file  Stress: Not on file  Social Connections: Not on file  Intimate Partner Violence: Not on file    Past Surgical History:  Procedure Laterality Date   ABDOMINAL HYSTERECTOMY     APPENDECTOMY      No family history on file.  Allergies  Allergen Reactions   Codeine Itching    Current Outpatient Medications on File Prior to Visit  Medication Sig Dispense Refill   aspirin 81 MG tablet Take 325 mg by mouth daily.  (Patient not taking: Reported on 08/12/2020)     B-Complex TABS See admin instructions.     Biotin 5000 MCG CAPS 1 tablet     calcium carbonate (OS-CAL) 600 MG TABS tablet Take 600 mg by mouth 2 (two) times daily with a meal.     Cetirizine HCl 10 MG CAPS Take by mouth.     cholecalciferol (VITAMIN D) 1000 UNITS tablet Take 3,000 Units by mouth daily.     Misc Natural Products (TURMERIC CURCUMIN) CAPS See admin instructions.     No current facility-administered medications on file prior to visit.    BP 115/73   Pulse 80   Temp 98.2 F (36.8 C)  Resp 18   Ht 5\' 4"  (1.626 m)   Wt 155 lb 9.6 oz (70.6 kg)   SpO2 99%   BMI 26.71 kg/m       Objective:   Physical Exam  General Mental Status- Alert. General Appearance- Not in acute distress.   Skin General: Color- Normal Color. Moisture- Normal Moisture.  Neck Carotid Arteries- Normal color. Moisture- Normal Moisture. No carotid bruits. No JVD.  Chest and Lung Exam Auscultation: Breath Sounds:-Normal.  Cardiovascular Auscultation:Rythm- Regular. Murmurs & Other Heart Sounds:Auscultation of the heart reveals- No Murmurs.   Neurologic Cranial Nerve exam:- CN III-XII intact(No nystagmus), symmetric smile.  Strength:- 5/5 equal and symmetric strength both upper and lower extremities.   Heent- no sinus pressure. Normal tm's. Normal pharynx.    Assessment & Plan:   Samuel [place] type/faint st presently. Though wed symptoms were worse. Sounds like improving.  At  this point will do send out covid as you and husband are sick and would be beneficial to know if you are positive though improving.  Flonase for nasal congestion. Benzonatate for cough.  If signs/symptoms worsen as discussed start azithromycin. Presently improving so may not need. Rx antibiotic sent to pharmacy/made available.  Follow up 7-10 days or as needed  [employer], [employer]

## 2020-11-22 LAB — NOVEL CORONAVIRUS, NAA: SARS-CoV-2, NAA: NOT DETECTED

## 2020-11-22 LAB — SARS-COV-2, NAA 2 DAY TAT

## 2021-01-14 DIAGNOSIS — Z1211 Encounter for screening for malignant neoplasm of colon: Secondary | ICD-10-CM | POA: Diagnosis not present

## 2021-01-14 DIAGNOSIS — Z01419 Encounter for gynecological examination (general) (routine) without abnormal findings: Secondary | ICD-10-CM | POA: Diagnosis not present

## 2021-01-14 DIAGNOSIS — M810 Age-related osteoporosis without current pathological fracture: Secondary | ICD-10-CM | POA: Diagnosis not present

## 2021-01-14 DIAGNOSIS — N952 Postmenopausal atrophic vaginitis: Secondary | ICD-10-CM | POA: Diagnosis not present

## 2021-01-14 DIAGNOSIS — Z1231 Encounter for screening mammogram for malignant neoplasm of breast: Secondary | ICD-10-CM | POA: Diagnosis not present

## 2021-01-14 DIAGNOSIS — R350 Frequency of micturition: Secondary | ICD-10-CM | POA: Diagnosis not present

## 2021-01-14 DIAGNOSIS — E559 Vitamin D deficiency, unspecified: Secondary | ICD-10-CM | POA: Diagnosis not present

## 2021-01-28 ENCOUNTER — Encounter: Payer: Self-pay | Admitting: Medical

## 2021-01-28 DIAGNOSIS — M810 Age-related osteoporosis without current pathological fracture: Secondary | ICD-10-CM | POA: Diagnosis not present

## 2021-01-28 DIAGNOSIS — M859 Disorder of bone density and structure, unspecified: Secondary | ICD-10-CM | POA: Diagnosis not present

## 2021-01-30 NOTE — Addendum Note (Signed)
Addended by: Anabel Halon on: 01/30/2021 09:55 AM   Modules accepted: Orders

## 2021-02-27 ENCOUNTER — Ambulatory Visit: Payer: Medicare Other

## 2021-02-27 ENCOUNTER — Other Ambulatory Visit (INDEPENDENT_AMBULATORY_CARE_PROVIDER_SITE_OTHER): Payer: Medicare Other

## 2021-02-27 ENCOUNTER — Other Ambulatory Visit: Payer: Self-pay

## 2021-02-27 DIAGNOSIS — M810 Age-related osteoporosis without current pathological fracture: Secondary | ICD-10-CM | POA: Diagnosis not present

## 2021-03-03 LAB — VITAMIN D 1,25 DIHYDROXY
Vitamin D 1, 25 (OH)2 Total: 31 pg/mL (ref 18–72)
Vitamin D2 1, 25 (OH)2: <8 pg/mL
Vitamin D3 1, 25 (OH)2: 31 pg/mL

## 2021-03-11 ENCOUNTER — Ambulatory Visit (INDEPENDENT_AMBULATORY_CARE_PROVIDER_SITE_OTHER): Payer: Medicare Other | Admitting: Internal Medicine

## 2021-03-11 ENCOUNTER — Other Ambulatory Visit: Payer: Self-pay

## 2021-03-11 ENCOUNTER — Encounter: Payer: Self-pay | Admitting: Internal Medicine

## 2021-03-11 VITALS — BP 152/89 | HR 77 | Temp 98.3°F | Resp 16 | Ht 64.0 in | Wt 159.5 lb

## 2021-03-11 DIAGNOSIS — J019 Acute sinusitis, unspecified: Secondary | ICD-10-CM | POA: Diagnosis not present

## 2021-03-11 DIAGNOSIS — R030 Elevated blood-pressure reading, without diagnosis of hypertension: Secondary | ICD-10-CM

## 2021-03-11 MED ORDER — AMOXICILLIN-POT CLAVULANATE 875-125 MG PO TABS: 1.0000 | ORAL_TABLET | Freq: Two times a day (BID) | ORAL | 0 refills | Status: DC

## 2021-03-11 NOTE — Progress Notes (Signed)
Subjective:    Patient ID: Erin Sampson, female    DOB: 12/14/1945, 75 y.o.   MRN: [id number]  DOS:  03/11/2021 Type of visit - description: Acute  Symptoms a started a week ago: Headache, mostly frontal. Developed bilateral sinus pressure. Subsequently left ear pressure, now bilateral ear pressure. Today she had a negative COVID test   Review of Systems No fever chills No chest pain no difficulty breathing No palpitation No cough or sputum production.    Past Medical History:  Diagnosis Date   Allergy    Bursitis    Depression    Diverticulitis    IBS (irritable bowel syndrome)     Past Surgical History:  Procedure Laterality Date   ABDOMINAL HYSTERECTOMY     APPENDECTOMY      Allergies as of 03/11/2021       Reactions   Codeine Itching        Medication List        Accurate as of March 11, 2021  6:43 PM. If you have any questions, ask your nurse or doctor.          STOP taking these medications    benzonatate 100 MG capsule Commonly known as: TESSALON Stopped by: Kathlene November, MD   Biotin 5000 MCG Caps Stopped by: Kathlene November, MD   Cetirizine HCl 10 MG Caps Stopped by: Kathlene November, MD       TAKE these medications    amoxicillin-clavulanate 875-125 MG tablet Commonly known as: Augmentin Take 1 tablet by mouth 2 (two) times daily. Started by: Kathlene November, MD   aspirin 81 MG tablet Take 325 mg by mouth daily.   B-Complex Tabs See admin instructions.   calcium carbonate 600 MG Tabs tablet Commonly known as: OS-CAL Take 600 mg by mouth 2 (two) times daily with a meal.   cholecalciferol 1000 units tablet Commonly known as: VITAMIN D Take 3,000 Units by mouth daily.   fluticasone 50 MCG/ACT nasal spray Commonly known as: FLONASE Place 2 sprays into both nostrils daily.   Turmeric Curcumin Caps See admin instructions.           Objective:   Physical Exam BP (!) 152/89 (BP Location: Left Arm, Patient Position: Sitting, Cuff  Size: Small)   Pulse 77   Temp 98.3 F (36.8 C) (Oral)   Resp 16   Ht 5\' 4"  (1.626 m)   Wt 159 lb 8 oz (72.3 kg)   SpO2 98%   BMI 27.38 kg/m  General:   Well developed, NAD, BMI noted. HEENT:  Normocephalic . Face symmetric, atraumatic. TMs: Slightly bulge. Throat: Symmetric, not red. Nose not congested, sinuses slightly TTP bilateral Lungs:  CTA B Normal respiratory effort, no intercostal retractions, no accessory muscle use. Heart: RRR,  no murmur.  Lower extremities: no pretibial edema bilaterally  Skin: Not pale. Not jaundice Neurologic:  alert & oriented X3.  Speech normal, gait appropriate for age and unassisted Psych--  Cognition and judgment appear intact.  Cooperative with normal attention span and concentration.  Behavior appropriate. No anxious or depressed appearing.      Assessment      75 year old female, PMH includes TIA, on aspirin, presents with: Mild sinusitis: Recommend rest, fluids, Tylenol. Nose congestion: Try Flonase and Astelin.. Augmentin for 1 week Call if not better Elevated BP: Minimal elevation, all previous readings normal, recommend to check BPs at home.  See AVS  This visit occurred during the SARS-CoV-2 public health emergency.  Safety protocols  were in place, including screening questions prior to the visit, additional usage of staff PPE, and extensive cleaning of exam room while observing appropriate contact time as indicated for disinfecting solutions.

## 2021-03-11 NOTE — Patient Instructions (Signed)
Rest, fluids , tylenol  IF  cough:  Take Mucinex DM twice a day as needed until better  For nasal congestion: Use OTC Nasocort or Flonase : 2 nasal sprays on each side of the nose in the morning until you feel better Use Astepro over-the-counter: 2 nasal sprays on each side of the nose at night until you feel better   Avoid decongestants such as  Pseudoephedrine or phenylephrine     Take the antibiotic as prescribed     Call if not gradually better over the next  10 days  Call anytime if the symptoms are severe   Check the  blood pressure twice a monthly  BP GOAL is between 110/65 and  135/85. If it is consistently higher or lower, let me know

## 2021-04-06 ENCOUNTER — Other Ambulatory Visit (HOSPITAL_BASED_OUTPATIENT_CLINIC_OR_DEPARTMENT_OTHER): Payer: Self-pay

## 2021-04-06 DIAGNOSIS — Z23 Encounter for immunization: Secondary | ICD-10-CM | POA: Diagnosis not present

## 2021-04-06 MED ORDER — INFLUENZA VAC A&B SA ADJ QUAD 0.5 ML IM PRSY
PREFILLED_SYRINGE | INTRAMUSCULAR | 0 refills | Status: DC
  Filled 2021-04-06: qty 0.5, 1d supply, fill #0

## 2021-04-14 DIAGNOSIS — L209 Atopic dermatitis, unspecified: Secondary | ICD-10-CM | POA: Diagnosis not present

## 2021-04-14 DIAGNOSIS — L820 Inflamed seborrheic keratosis: Secondary | ICD-10-CM | POA: Diagnosis not present

## 2021-04-14 DIAGNOSIS — L219 Seborrheic dermatitis, unspecified: Secondary | ICD-10-CM | POA: Diagnosis not present

## 2021-04-14 DIAGNOSIS — L853 Xerosis cutis: Secondary | ICD-10-CM | POA: Diagnosis not present

## 2021-04-17 ENCOUNTER — Ambulatory Visit: Payer: Medicare Other

## 2021-05-26 ENCOUNTER — Telehealth: Payer: Self-pay | Admitting: Medical

## 2021-05-26 ENCOUNTER — Ambulatory Visit (INDEPENDENT_AMBULATORY_CARE_PROVIDER_SITE_OTHER): Payer: Medicare Other | Admitting: Family

## 2021-05-26 ENCOUNTER — Encounter: Payer: Self-pay | Admitting: Family

## 2021-05-26 VITALS — BP 144/60 | HR 102 | Temp 97.6°F | Ht 64.0 in | Wt 163.2 lb

## 2021-05-26 DIAGNOSIS — U071 COVID-19: Secondary | ICD-10-CM | POA: Diagnosis not present

## 2021-05-26 MED ORDER — MOLNUPIRAVIR EUA 200MG CAPSULE: 4.0000 | ORAL_CAPSULE | Freq: Two times a day (BID) | ORAL | 0 refills | Status: AC

## 2021-05-26 NOTE — Progress Notes (Signed)
Erin Sampson is a 75 y.o. female with the following history as recorded in [facility]:  Patient Active Problem List   Diagnosis Date Noted   TIA (transient ischemic attack) 08/27/2013    Current Outpatient Medications  Medication Sig Dispense Refill   aspirin 81 MG tablet Take 325 mg by mouth daily.     calcium carbonate (OS-CAL) 600 MG TABS tablet Take 600 mg by mouth 2 (two) times daily with a meal.     cholecalciferol (VITAMIN D) 1000 UNITS tablet Take 3,000 Units by mouth daily.     molnupiravir EUA (LAGEVRIO) 200 mg CAPS capsule Take 4 capsules (800 mg total) by mouth 2 (two) times daily for 5 days. 40 capsule 0   influenza vaccine adjuvanted (FLUAD) 0.5 ML injection Inject into the muscle. (Patient not taking: Reported on 05/26/2021) 0.5 mL 0   No current facility-administered medications for this visit.    Allergies: Codeine and Clorox nasal antiseptic [povidone-iodine]  Past Medical History:  Diagnosis Date   Allergy    Bursitis    Depression    Diverticulitis    IBS (irritable bowel syndrome)     Past Surgical History:  Procedure Laterality Date   ABDOMINAL HYSTERECTOMY     APPENDECTOMY      No family history on file.  Social History   Tobacco Use   Smoking status: Former    Packs/day: 1.00    Years: 20.00    Pack years: 20.00    Types: Cigarettes    Quit date: 03/16/1983    Years since quitting: 38.2   Smokeless tobacco: Never   Tobacco comments:    Quit at age 21   Substance Use Topics   Alcohol use: No    Subjective:   Started last night with sudden onset of left sided sinus congestion/ left sided facial pain; + post-nasal drainage; no fever or shortness of breath; took home COVID test this am and it was positive;  Has had 2 COVID vaccines/ 1 booster;  Admits she is very upset and anxious at time of OV due to concerns about her husband's underlying health issues and that he probably has COVID as well;      Objective:  Vitals:   05/26/21 1036   BP: (!) 144/60  Pulse: (!) 102  Temp: 97.6 F (36.4 C)  TempSrc: Oral  SpO2: 98%  Weight: 163 lb 3.2 oz (74 kg)  Height: 5\' 4"  (1.626 m)    General: Well developed, well nourished, in no acute distress  Skin : Warm and dry.  Head: Normocephalic and atraumatic  Eyes: Sclera and conjunctiva clear; pupils round and reactive to light; extraocular movements intact  Ears: External normal; canals clear; tympanic membranes normal  Oropharynx: Pink, supple. No suspicious lesions  Neck: Supple without thyromegaly, adenopathy  Lungs: Respirations unlabored; clear to auscultation bilaterally without wheeze, rales, rhonchi  CVS exam: normal rate and regular rhythm.  Neurologic: Alert and oriented; speech intact; face symmetrical; moves all extremities well; CNII-XII intact without focal deficit   Assessment:  1. COVID-19     Plan:  Patient shows picture of her home test this morning which was positive; Rx for Molnupiravir- taker as directed; symptomatic treatment discussed; increase fluids, rest and follow up worse, no better.   This visit occurred during the SARS-CoV-2 public health emergency.  Safety protocols were in place, including screening questions prior to the visit, additional usage of staff PPE, and extensive cleaning of exam room while observing appropriate contact time  as indicated for disinfecting solutions.    No follow-ups on file.  No orders of the defined types were placed in this encounter.   Requested Prescriptions   Signed Prescriptions Disp Refills   molnupiravir EUA (LAGEVRIO) 200 mg CAPS capsule 40 capsule 0    Sig: Take 4 capsules (800 mg total) by mouth 2 (two) times daily for 5 days.

## 2021-05-26 NOTE — Telephone Encounter (Signed)
Patient is calling back for an update oh her request. Advised to wait while we are working on it.

## 2021-05-26 NOTE — Telephone Encounter (Signed)
Pt tested positive for covid and also her husband, Ambroise. She wad seen by Mickel Baas today, and she told her that she would send in medication for both of them. Pt's husband is diabetic, has heart problems, and is on a lot of medications and she was just calling to make sure he would get the right medication that wouldn't effect anything. He also has a more serious cough that she does not have and wanted him to get medication for that as well. She also wanted to make sure he could take tylenol, and if his prescriptions could be sent today since she is already going to go there to pick up hers. Please advise.   [facility] [id number] - [address], [place] [address]  Phone:  [phone]  Fax:  [phone]

## 2021-05-26 NOTE — Telephone Encounter (Signed)
Open to review message she sent me.  Staff placed her message regarding her husband and her chart.

## 2021-05-27 NOTE — Telephone Encounter (Signed)
Pt notified via mychart

## 2021-06-04 ENCOUNTER — Ambulatory Visit: Payer: Medicare Other

## 2021-06-09 ENCOUNTER — Ambulatory Visit: Payer: Medicare Other

## 2021-07-30 DIAGNOSIS — H2513 Age-related nuclear cataract, bilateral: Secondary | ICD-10-CM | POA: Diagnosis not present

## 2021-07-30 DIAGNOSIS — H31103 Choroidal degeneration, unspecified, bilateral: Secondary | ICD-10-CM | POA: Diagnosis not present

## 2021-07-30 DIAGNOSIS — H43822 Vitreomacular adhesion, left eye: Secondary | ICD-10-CM | POA: Diagnosis not present

## 2021-07-30 DIAGNOSIS — H1045 Other chronic allergic conjunctivitis: Secondary | ICD-10-CM | POA: Diagnosis not present

## 2021-07-30 DIAGNOSIS — H04123 Dry eye syndrome of bilateral lacrimal glands: Secondary | ICD-10-CM | POA: Diagnosis not present

## 2021-07-30 DIAGNOSIS — H43811 Vitreous degeneration, right eye: Secondary | ICD-10-CM | POA: Diagnosis not present

## 2021-08-05 DIAGNOSIS — C44329 Squamous cell carcinoma of skin of other parts of face: Secondary | ICD-10-CM | POA: Diagnosis not present

## 2021-08-05 DIAGNOSIS — L219 Seborrheic dermatitis, unspecified: Secondary | ICD-10-CM | POA: Diagnosis not present

## 2021-08-19 DIAGNOSIS — D0439 Carcinoma in situ of skin of other parts of face: Secondary | ICD-10-CM | POA: Diagnosis not present

## 2021-08-28 ENCOUNTER — Emergency Department (HOSPITAL_BASED_OUTPATIENT_CLINIC_OR_DEPARTMENT_OTHER)
Admission: EM | Admit: 2021-08-28 | Discharge: 2021-08-28 | Disposition: A | Discharge status: Home / Self Care | Payer: Medicare Other | Attending: Student | Admitting: Student

## 2021-08-28 ENCOUNTER — Encounter (HOSPITAL_BASED_OUTPATIENT_CLINIC_OR_DEPARTMENT_OTHER): Payer: Self-pay

## 2021-08-28 ENCOUNTER — Other Ambulatory Visit: Payer: Self-pay

## 2021-08-28 DIAGNOSIS — Z85828 Personal history of other malignant neoplasm of skin: Secondary | ICD-10-CM | POA: Insufficient documentation

## 2021-08-28 DIAGNOSIS — Z87891 Personal history of nicotine dependence: Secondary | ICD-10-CM | POA: Insufficient documentation

## 2021-08-28 DIAGNOSIS — C44329 Squamous cell carcinoma of skin of other parts of face: Secondary | ICD-10-CM | POA: Diagnosis not present

## 2021-08-28 DIAGNOSIS — Z7982 Long term (current) use of aspirin: Secondary | ICD-10-CM | POA: Diagnosis not present

## 2021-08-28 DIAGNOSIS — R21 Rash and other nonspecific skin eruption: Secondary | ICD-10-CM | POA: Diagnosis not present

## 2021-08-28 NOTE — ED Triage Notes (Signed)
Right forehead rash noted x yesterday - Patient believes it is shingles.  ?

## 2021-08-28 NOTE — ED Provider Notes (Signed)
?[facility] ?Provider Note ? ?CSN: [id number] ?Arrival date & time: 08/28/21 2049 ? ?Chief Complaint(s) ?Rash ? ?HPI ?Erin Sampson is a 76 y.o. female with PMH squamous cell carcinoma of the face, depression, diverticulitis who presents the emergency department for evaluation of facial rash.  Patient states that she noticed a lesion to her right forehead at the hairline and is concerned that she has shingles.  She denies chest pain, shortness of breath, Donnell pain, nausea, vomiting or other systemic symptoms.  No fever. ? ? ?Rash ? ?Past Medical History ?Past Medical History:  ?Diagnosis Date  ? Allergy   ? Bursitis   ? Depression   ? Diverticulitis   ? IBS (irritable bowel syndrome)   ? ?Patient Active Problem List  ? Diagnosis Date Noted  ? TIA (transient ischemic attack) 08/27/2013  ? ?Home Medication(s) ?Prior to Admission medications   ?Medication Sig Start Date End Date Taking? Authorizing Provider  ?aspirin 81 MG tablet Take 325 mg by mouth daily.    [provider]  ?calcium carbonate (OS-CAL) 600 MG TABS tablet Take 600 mg by mouth 2 (two) times daily with a meal.    [provider]  ?cholecalciferol (VITAMIN D) 1000 UNITS tablet Take 3,000 Units by mouth daily.    [provider]  ?influenza vaccine adjuvanted (FLUAD) 0.5 ML injection Inject into the muscle. ?Patient not taking: Reported on 05/26/2021 04/06/21   Carlyle Basques, MD  ?                                                                                                                                  ?Past Surgical History ?Past Surgical History:  ?Procedure Laterality Date  ? ABDOMINAL HYSTERECTOMY    ? APPENDECTOMY    ? ?Family History ?No family history on file. ? ?Social History ?Social History  ? ?Tobacco Use  ? Smoking status: Former  ?  Packs/day: 1.00  ?  Years: 20.00  ?  Pack years: 20.00  ?  Types: Cigarettes  ?  Quit date: 03/16/1983  ?  Years since quitting: 38.4  ?  Smokeless tobacco: Never  ? Tobacco comments:  ?  Quit at age 53   ?Vaping Use  ? Vaping Use: Never used  ?Substance Use Topics  ? Alcohol use: No  ? Drug use: No  ? ?Allergies ?Codeine and Clorox nasal antiseptic [povidone-iodine] ? ?Review of Systems ?Review of Systems  ?Skin:  Positive for rash.  ? ?Physical Exam ?Vital Signs  ?I have reviewed the triage vital signs ?BP (!) 159/77   Pulse 80   Temp 97.9 ?F (36.6 ?C) (Oral)   Resp 16   Ht '5\' 5"'$  (1.651 m)   Wt 72.1 kg   SpO2 98%   BMI 26.46 kg/m?  ? ?Physical Exam ?Vitals and nursing note reviewed.  ?Constitutional:   ?   General: She is not in acute distress. ?  Appearance: She is well-developed.  ?HENT:  ?   Head: Normocephalic and atraumatic.  ?Eyes:  ?   Conjunctiva/sclera: Conjunctivae normal.  ?Cardiovascular:  ?   Rate and Rhythm: Normal rate and regular rhythm.  ?   Heart sounds: No murmur heard. ?Pulmonary:  ?   Effort: Pulmonary effort is normal. No respiratory distress.  ?   Breath sounds: Normal breath sounds.  ?Abdominal:  ?   Palpations: Abdomen is soft.  ?   Tenderness: There is no abdominal tenderness.  ?Musculoskeletal:     ?   General: No swelling.  ?   Cervical back: Neck supple.  ?Skin: ?   General: Skin is warm and dry.  ?   Capillary Refill: Capillary refill takes less than 2 seconds.  ?   Findings: Rash present.  ?Neurological:  ?   Mental Status: She is alert.  ?Psychiatric:     ?   Mood and Affect: Mood normal.  ? ? ?ED Results and Treatments ?Labs ?(all labs ordered are listed, but only abnormal results are displayed) ?Labs Reviewed - No data to display                                                                                                                       ? ?Radiology ?No results found. ? ?Pertinent labs & imaging results that were available during my care of the patient were reviewed by me and considered in my medical decision making (see MDM for details). ? ?Medications Ordered in ED ?Medications - No data to  display                                                               ?                                                                    ?Procedures ?Procedures ? ?(including critical care time) ? ?Medical Decision Making / ED Course ? ? ?This patient presents to the ED for concern of rash, this involves an extensive number of treatment options, and is a complaint that carries with it a high risk of complications and morbidity.  The differential diagnosis includes squamous cell carcinoma, cellulitis, shingles, drug reaction ? ?MDM: ?Patient seen emergency department for evaluation of a rash.  Patient on physical exam has a single crusted lesion at the hairline on the right forehead consistent with squamous cell carcinoma.  Low suspicion for shingles as there are no additional lesions.  Patient recently had a squamous cell carcinoma removed from  her cheek.  Patient discharged with dermatology follow-up ? ? ?Additional history obtained: ? ?-External records from outside source obtained and reviewed including: Chart review including previous notes, labs, imaging, consultation notes ? ? ?Lab Tests: ?-I ordered, reviewed, and interpreted labs.   ?The pertinent results include:   ?Labs Reviewed - No data to display  ? ? ? ?Medicines ordered and prescription drug management: ?No orders of the defined types were placed in this encounter. ?  ?-I have reviewed the patients home medicines and have made adjustments as needed ? ?Critical interventions ?none ?Cardiac Monitoring: ?The patient was maintained on a cardiac monitor.  I personally viewed and interpreted the cardiac monitored which showed an underlying rhythm of: NSR ? ?Social Determinants of Health:  ?Factors impacting patients care include: none ? ? ?Reevaluation: ?After the interventions noted above, I reevaluated the patient and found that they have :stayed the same ? ?Co morbidities that complicate the patient evaluation ? ?Past Medical History:  ?Diagnosis Date   ? Allergy   ? Bursitis   ? Depression   ? Diverticulitis   ? IBS (irritable bowel syndrome)   ?  ? ? ?Dispostion: ?I considered admission for this patient, but her squamous of carcinoma can be addressed in dermatology clinic and does not meet inpatient criteria for admission. ? ? ? ? ?Final Clinical Impression(s) / ED Diagnoses ?Final diagnoses:  ?None  ? ? ? ?'@PCDICTATION'$ @ ? ?  ?Teressa Lower, MD ?08/28/21 2313 ? ?

## 2021-08-31 DIAGNOSIS — C4442 Squamous cell carcinoma of skin of scalp and neck: Secondary | ICD-10-CM | POA: Diagnosis not present

## 2021-10-06 DIAGNOSIS — L578 Other skin changes due to chronic exposure to nonionizing radiation: Secondary | ICD-10-CM | POA: Diagnosis not present

## 2021-10-06 DIAGNOSIS — L814 Other melanin hyperpigmentation: Secondary | ICD-10-CM | POA: Diagnosis not present

## 2021-10-06 DIAGNOSIS — B079 Viral wart, unspecified: Secondary | ICD-10-CM | POA: Diagnosis not present

## 2021-10-06 DIAGNOSIS — D225 Melanocytic nevi of trunk: Secondary | ICD-10-CM | POA: Diagnosis not present

## 2021-11-26 ENCOUNTER — Encounter: Payer: Self-pay | Admitting: General Practice

## 2021-12-16 DIAGNOSIS — H2513 Age-related nuclear cataract, bilateral: Secondary | ICD-10-CM | POA: Diagnosis not present

## 2021-12-16 DIAGNOSIS — H43811 Vitreous degeneration, right eye: Secondary | ICD-10-CM | POA: Diagnosis not present

## 2021-12-16 DIAGNOSIS — H04123 Dry eye syndrome of bilateral lacrimal glands: Secondary | ICD-10-CM | POA: Diagnosis not present

## 2021-12-16 DIAGNOSIS — H31103 Choroidal degeneration, unspecified, bilateral: Secondary | ICD-10-CM | POA: Diagnosis not present

## 2021-12-16 DIAGNOSIS — H1045 Other chronic allergic conjunctivitis: Secondary | ICD-10-CM | POA: Diagnosis not present

## 2021-12-16 DIAGNOSIS — H43823 Vitreomacular adhesion, bilateral: Secondary | ICD-10-CM | POA: Diagnosis not present

## 2022-01-04 ENCOUNTER — Ambulatory Visit (INDEPENDENT_AMBULATORY_CARE_PROVIDER_SITE_OTHER): Payer: Medicare Other

## 2022-01-04 VITALS — Ht 64.0 in | Wt 150.0 lb

## 2022-01-04 DIAGNOSIS — Z0000 Encounter for general adult medical examination without abnormal findings: Secondary | ICD-10-CM | POA: Diagnosis not present

## 2022-01-04 NOTE — Patient Instructions (Signed)
Erin Sampson , Thank you for taking time to complete your Medicare Wellness Visit. I appreciate your ongoing commitment to your health goals. Please review the following plan we discussed and let me know if I can assist you in the future.   Screening recommendations/referrals: Colonoscopy: Per our conversation, you will check to see when last colonoscopy was done. Mammogram: Completed 01/28/2021-Due 01/28/2022 Bone Density:  Completed 01/28/2021-Due 01/29/2023 Recommended yearly ophthalmology/optometry visit for glaucoma screening and checkup Recommended yearly dental visit for hygiene and checkup  Vaccinations: Influenza vaccine: UP to date Pneumococcal vaccine: Up to date Tdap vaccine: Up to date Shingles vaccine: Due-May obtain vaccine at your local pharmacy. Covid-19:Declined booster  Advanced directives: Please bring a copy of Living Will and/or Healthcare Power of Attorney for your chart.   Conditions/risks identified: See problem list  Next appointment: Follow up in one year for your annual wellness visit    Preventive Care 65 Years and Older, Female Preventive care refers to lifestyle choices and visits with your health care provider that can promote health and wellness. What does preventive care include? A yearly physical exam. This is also called an annual well check. Dental exams once or twice a year. Routine eye exams. Ask your health care provider how often you should have your eyes checked. Personal lifestyle choices, including: Daily care of your teeth and gums. Regular physical activity. Eating a healthy diet. Avoiding tobacco and drug use. Limiting alcohol use. Practicing safe sex. Taking low-dose aspirin every day. Taking vitamin and mineral supplements as recommended by your health care provider. What happens during an annual well check? The services and screenings done by your health care provider during your annual well check will depend on your age, overall  health, lifestyle risk factors, and family history of disease. Counseling  Your health care provider may ask you questions about your: Alcohol use. Tobacco use. Drug use. Emotional well-being. Home and relationship well-being. Sexual activity. Eating habits. History of falls. Memory and ability to understand (cognition). Work and work Statistician. Reproductive health. Screening  You may have the following tests or measurements: Height, weight, and BMI. Blood pressure. Lipid and cholesterol levels. These may be checked every 5 years, or more frequently if you are over 57 years old. Skin check. Lung cancer screening. You may have this screening every year starting at age 57 if you have a 30-pack-year history of smoking and currently smoke or have quit within the past 15 years. Fecal occult blood test (FOBT) of the stool. You may have this test every year starting at age 61. Flexible sigmoidoscopy or colonoscopy. You may have a sigmoidoscopy every 5 years or a colonoscopy every 10 years starting at age 72. Hepatitis C blood test. Hepatitis B blood test. Sexually transmitted disease (STD) testing. Diabetes screening. This is done by checking your blood sugar (glucose) after you have not eaten for a while (fasting). You may have this done every 1-3 years. Bone density scan. This is done to screen for osteoporosis. You may have this done starting at age 66. Mammogram. This may be done every 1-2 years. Talk to your health care provider about how often you should have regular mammograms. Talk with your health care provider about your test results, treatment options, and if necessary, the need for more tests. Vaccines  Your health care provider may recommend certain vaccines, such as: Influenza vaccine. This is recommended every year. Tetanus, diphtheria, and acellular pertussis (Tdap, Td) vaccine. You may need a Td booster every 10 years.  Zoster vaccine. You may need this after age  32. Pneumococcal 13-valent conjugate (PCV13) vaccine. One dose is recommended after age 71. Pneumococcal polysaccharide (PPSV23) vaccine. One dose is recommended after age 57. Talk to your health care provider about which screenings and vaccines you need and how often you need them. This information is not intended to replace advice given to you by your health care provider. Make sure you discuss any questions you have with your health care provider. Document Released: 06/13/2015 Document Revised: 02/04/2016 Document Reviewed: 03/18/2015 Elsevier Interactive Patient Education  2017 [employer] Prevention in the Home Falls can cause injuries. They can happen to people of all ages. There are many things you can do to make your home safe and to help prevent falls. What can I do on the outside of my home? Regularly fix the edges of walkways and driveways and fix any cracks. Remove anything that might make you trip as you walk through a door, such as a raised step or threshold. Trim any bushes or trees on the path to your home. Use bright outdoor lighting. Clear any walking paths of anything that might make someone trip, such as rocks or tools. Regularly check to see if handrails are loose or broken. Make sure that both sides of any steps have handrails. Any raised decks and porches should have guardrails on the edges. Have any leaves, snow, or ice cleared regularly. Use sand or salt on walking paths during winter. Clean up any spills in your garage right away. This includes oil or grease spills. What can I do in the bathroom? Use night lights. Install grab bars by the toilet and in the tub and shower. Do not use towel bars as grab bars. Use non-skid mats or decals in the tub or shower. If you need to sit down in the shower, use a plastic, non-slip stool. Keep the floor dry. Clean up any water that spills on the floor as soon as it happens. Remove soap buildup in the tub or shower  regularly. Attach bath mats securely with double-sided non-slip rug tape. Do not have throw rugs and other things on the floor that can make you trip. What can I do in the bedroom? Use night lights. Make sure that you have a light by your bed that is easy to reach. Do not use any sheets or blankets that are too big for your bed. They should not hang down onto the floor. Have a firm chair that has side arms. You can use this for support while you get dressed. Do not have throw rugs and other things on the floor that can make you trip. What can I do in the kitchen? Clean up any spills right away. Avoid walking on wet floors. Keep items that you use a lot in easy-to-reach places. If you need to reach something above you, use a strong step stool that has a grab bar. Keep electrical cords out of the way. Do not use floor polish or wax that makes floors slippery. If you must use wax, use non-skid floor wax. Do not have throw rugs and other things on the floor that can make you trip. What can I do with my stairs? Do not leave any items on the stairs. Make sure that there are handrails on both sides of the stairs and use them. Fix handrails that are broken or loose. Make sure that handrails are as long as the stairways. Check any carpeting to make sure that  it is firmly attached to the stairs. Fix any carpet that is loose or worn. Avoid having throw rugs at the top or bottom of the stairs. If you do have throw rugs, attach them to the floor with carpet tape. Make sure that you have a light switch at the top of the stairs and the bottom of the stairs. If you do not have them, ask someone to add them for you. What else can I do to help prevent falls? Wear shoes that: Do not have high heels. Have rubber bottoms. Are comfortable and fit you well. Are closed at the toe. Do not wear sandals. If you use a stepladder: Make sure that it is fully opened. Do not climb a closed stepladder. Make sure that  both sides of the stepladder are locked into place. Ask someone to hold it for you, if possible. Clearly mark and make sure that you can see: Any grab bars or handrails. First and last steps. Where the edge of each step is. Use tools that help you move around (mobility aids) if they are needed. These include: Canes. Walkers. Scooters. Crutches. Turn on the lights when you go into a dark area. Replace any light bulbs as soon as they burn out. Set up your furniture so you have a clear path. Avoid moving your furniture around. If any of your floors are uneven, fix them. If there are any pets around you, be aware of where they are. Review your medicines with your doctor. Some medicines can make you feel dizzy. This can increase your chance of falling. Ask your doctor what other things that you can do to help prevent falls. This information is not intended to replace advice given to you by your health care provider. Make sure you discuss any questions you have with your health care provider. Document Released: 03/13/2009 Document Revised: 10/23/2015 Document Reviewed: 06/21/2014 Elsevier Interactive Patient Education  2017 [employer].

## 2022-01-04 NOTE — Progress Notes (Addendum)
Subjective:   Erin Sampson is a 76 y.o. female who presents for an Initial Medicare Annual Wellness Visit.  I connected with Lanay today by telephone and verified that I am speaking with the correct person using two identifiers. Location patient: home Location provider: work Persons participating in the virtual visit: patient, Marine scientist.    I discussed the limitations, risks, security and privacy concerns of performing an evaluation and management service by telephone and the availability of in person appointments. I also discussed with the patient that there may be a patient responsible charge related to this service. The patient expressed understanding and verbally consented to this telephonic visit.    Interactive audio and video telecommunications were attempted between this provider and patient, however failed, due to patient having technical difficulties OR patient did not have access to video capability.  We continued and completed visit with audio only.  Some vital signs may be absent or patient reported.   Time Spent with patient on telephone encounter: 20 minutes   Review of Systems     Cardiac Risk Factors include: advanced age (>15mn, >>48women)     Objective:    Today's Vitals   01/04/22 1226  Weight: 150 lb (68 kg)  Height: '5\' 4"'$  (1.626 m)   Body mass index is 25.75 kg/m.     01/04/2022   12:30 PM 08/28/2021    9:00 PM 01/26/2020   11:16 AM  Advanced Directives  Does Patient Have a Medical Advance Directive? Yes No No  Type of AParamedicof ABard CollegeLiving will    Copy of HPeterin Chart? No - copy requested    Would patient like information on creating a medical advance directive?  No - Patient declined     Current Medications (verified) Outpatient Encounter Medications as of 01/04/2022  Medication Sig   aspirin 81 MG tablet Take 325 mg by mouth daily.   calcium carbonate (OS-CAL) 600 MG TABS tablet Take  600 mg by mouth 2 (two) times daily with a meal.   cholecalciferol (VITAMIN D) 1000 UNITS tablet Take 3,000 Units by mouth daily.   magnesium gluconate (MAGONATE) 500 MG tablet Take 500 mg by mouth 2 (two) times daily.   Turmeric 1053 MG TABS Take by mouth.   B Complex Vitamins (B COMPLEX 1 PO) B Complex   influenza vaccine adjuvanted (FLUAD) 0.5 ML injection Inject into the muscle. (Patient not taking: Reported on 05/26/2021)   No facility-administered encounter medications on file as of 01/04/2022.    Allergies (verified) Codeine and Clorox nasal antiseptic [povidone-iodine]   History: Past Medical History:  Diagnosis Date   Allergy    Bursitis    Depression    Diverticulitis    IBS (irritable bowel syndrome)    Past Surgical History:  Procedure Laterality Date   ABDOMINAL HYSTERECTOMY     APPENDECTOMY     No family history on file. Social History   Socioeconomic History   Marital status: Married    Spouse name: Not on file   Number of children: Not on file   Years of education: Not on file   Highest education level: Not on file  Occupational History   Occupation: retired  Tobacco Use   Smoking status: Former    Packs/day: 1.00    Years: 20.00    Total pack years: 20.00    Types: Cigarettes    Quit date: 03/16/1983    Years since quitting: 3328  Smokeless tobacco: Never   Tobacco comments:    Quit at age 83   Vaping Use   Vaping Use: Never used  Substance and Sexual Activity   Alcohol use: No   Drug use: No   Sexual activity: Not Currently  Other Topics Concern   Not on file  Social History Narrative   Married, with 2 children   2 yrs of college   Right handed   3-4 cups daily   Social Determinants of Health   Financial Resource Strain: Low Risk  (01/04/2022)   Overall Financial Resource Strain (CARDIA)    Difficulty of Paying Living Expenses: Not hard at all  Food Insecurity: No Food Insecurity (01/04/2022)   Hunger Vital Sign    Worried About  Running Out of Food in the Last Year: Never true    Ran Out of Food in the Last Year: Never true  Transportation Needs: No Transportation Needs (01/04/2022)   PRAPARE - Hydrologist (Medical): No    Lack of Transportation (Non-Medical): No  Physical Activity: Inactive (01/04/2022)   Exercise Vital Sign    Days of Exercise per Week: 0 days    Minutes of Exercise per Session: 0 min  Stress: No Stress Concern Present (01/04/2022)   [place]    Feeling of Stress : Not at all  Social Connections: Moderately Isolated (01/04/2022)   Social Connection and Isolation Panel [NHANES]    Frequency of Communication with Friends and Family: More than three times a week    Frequency of Social Gatherings with Friends and Family: More than three times a week    Attends Religious Services: Never    Marine scientist or Organizations: No    Attends Music therapist: Never    Marital Status: Married    Tobacco Counseling Counseling given: Not Answered Tobacco comments: Quit at age 24    Clinical Intake:  Pre-visit preparation completed: Yes  Pain : No/denies pain     BMI - recorded: 25.75 Nutritional Status: BMI 25 -29 Overweight Nutritional Risks: None Diabetes: No  How often do you need to have someone help you when you read instructions, pamphlets, or other written materials from your doctor or pharmacy?: 1 - Never  Diabetic?No  Interpreter Needed?: No  Information entered by :: Caroleen Hamman LPN   Activities of Daily Living    01/04/2022   12:33 PM  In your present state of health, do you have any difficulty performing the following activities:  Hearing? 1  Comment mild per patient  Vision? 0  Difficulty concentrating or making decisions? 0  Walking or climbing stairs? 0  Dressing or bathing? 0  Doing errands, shopping? 0  Preparing Food and eating ? N  Using the  Toilet? N  In the past six months, have you accidently leaked urine? N  Do you have problems with loss of bowel control? N  Managing your Medications? N  Managing your Finances? N  Housekeeping or managing your Housekeeping? N    Patient Care Team: [name], Iris Pert as PCP - General (Internal Medicine)  Indicate any recent Medical Services you may have received from other than [facility] providers in the past year (date may be approximate).     Assessment:   This is a routine wellness examination for Erin Sampson.  Hearing/Vision screen Hearing Screening - Comments:: States she was told in the past that she does have some hearing  loss Vision Screening - Comments:: Last eye exam-11/2021-Dr. Groat  Dietary issues and exercise activities discussed: Current Exercise Habits: The patient does not participate in regular exercise at present, Exercise limited by: None identified   Goals Addressed             This Visit's Progress    Patient Stated       Maintain current health       Depression Screen    01/04/2022   12:33 PM 05/26/2021   10:39 AM 08/12/2020    1:48 PM  PHQ 2/9 Scores  PHQ - 2 Score 0 0 0    Fall Risk    01/04/2022   12:31 PM 05/26/2021   10:38 AM  [place] in the past year? 0 0  Number falls in past yr: 0 0  Injury with Fall? 0 0  Risk for fall due to :  No Fall Risks  Follow up Falls prevention discussed Falls evaluation completed    [place]:  Any stairs in or around the home? No  Home free of loose throw rugs in walkways, pet beds, electrical cords, etc? Yes  Adequate lighting in your home to reduce risk of falls? Yes   ASSISTIVE DEVICES UTILIZED TO PREVENT FALLS:  Life alert? No  Use of a cane, walker or w/c? No  Grab bars in the bathroom? No  Shower chair or bench in shower? Yes  Elevated toilet seat or a handicapped toilet? No   TIMED UP AND GO:  Was the test performed? No . Phone  visit   Cognitive Function:Normal cognitive status assessed by this Nurse Health Advisor. No abnormalities found.          Immunizations Immunization History  Administered Date(s) Administered   Fluad Quad(high Dose 65+) 04/06/2021   PFIZER(Purple Top)SARS-COV-2 Vaccination 06/16/2019, 07/04/2019, 04/18/2020   Pneumococcal Conjugate-13 07/02/2015   Pneumococcal Polysaccharide-23 12/21/2016   Tdap 11/25/2015   Zoster, Live 07/02/2015    TDAP status: Up to date  Flu Vaccine status: Up to date  Pneumococcal vaccine status: Up to date  Covid-19 vaccine status: Information provided on how to obtain vaccines.   Qualifies for Shingles Vaccine? Yes   Zostavax completed Yes   Shingrix Completed?: No.    Education has been provided regarding the importance of this vaccine. Patient has been advised to call insurance company to determine out of pocket expense if they have not yet received this vaccine. Advised may also receive vaccine at local pharmacy or Health Dept. Verbalized acceptance and understanding.  Screening Tests Health Maintenance  Topic Date Due   Zoster Vaccines- Shingrix (1 of 2) Never done   INFLUENZA VACCINE  12/29/2021   COVID-19 Vaccine (4 - Booster for Pfizer series) 01/20/2022 (Originally 06/13/2020)   TETANUS/TDAP  11/24/2025   Pneumonia Vaccine 50+ Years old  Completed   DEXA SCAN  Completed   Hepatitis C Screening  Completed   HPV VACCINES  Aged Out    Health Maintenance  Health Maintenance Due  Topic Date Due   Zoster Vaccines- Shingrix (1 of 2) Never done   INFLUENZA VACCINE  12/29/2021    Colorectal cancer screening: No record of last colonoscopy. Patient states she will find out  & follow up.  Mammogram status: Completed bilateral 01/28/2021. Repeat every year  Bone Density status: Completed 01/28/2021. Results reflect: Bone density results: OSTEOPOROSIS. Repeat every 2 years.  Lung Cancer Screening: (Low Dose CT Chest recommended if Age  55-80  years, 30 pack-year currently smoking OR have quit w/in 15years.) does not qualify.  Additional Screening:  Hepatitis C Screening: Completed 08/12/2020  Vision Screening: Recommended annual ophthalmology exams for early detection of glaucoma and other disorders of the eye. Is the patient up to date with their annual eye exam?  Yes  Who is the provider or what is the name of the office in which the patient attends annual eye exams? Dr. Katy Fitch   Dental Screening: Recommended annual dental exams for proper oral hygiene  Community Resource Referral / Chronic Care Management: CRR required this visit?  No   CCM required this visit?  No      Plan:     I have personally reviewed and noted the following in the patient's chart:   Medical and social history Use of alcohol, tobacco or illicit drugs  Current medications and supplements including opioid prescriptions. Patient is not currently taking opioid prescriptions. Functional ability and status Nutritional status Physical activity Advanced directives List of other physicians Hospitalizations, surgeries, and ER visits in previous 12 months Vitals Screenings to include cognitive, depression, and falls Referrals and appointments  In addition, I have reviewed and discussed with patient certain preventive protocols, quality metrics, and best practice recommendations. A written personalized care plan for preventive services as well as general preventive health recommendations were provided to patient.   Due to this being a telephonic visit, the after visit summary with patients personalized plan was offered to patient via mail or my-chart. Patient would like to access on my-chart.   Marta Antu, LPN   09/03/5033  Nurse Health Advisor  Nurse Notes: None  Review and Agree with assessment & plan of LPN   Mackie Pai, PA-C

## 2022-01-16 IMAGING — CR DG HAND COMPLETE 3+V*R*
3 series · 3 of 3 positions shown · non-contrast
Comparison: None.

CLINICAL DATA: Bilateral hand pain.

EXAM:
LEFT HAND - COMPLETE 3+ VIEW; LEFT WRIST - COMPLETE 3+ VIEW; RIGHT
HAND - COMPLETE 3+ VIEW; RIGHT WRIST - COMPLETE 3+ VIEW

[x hand pa right]
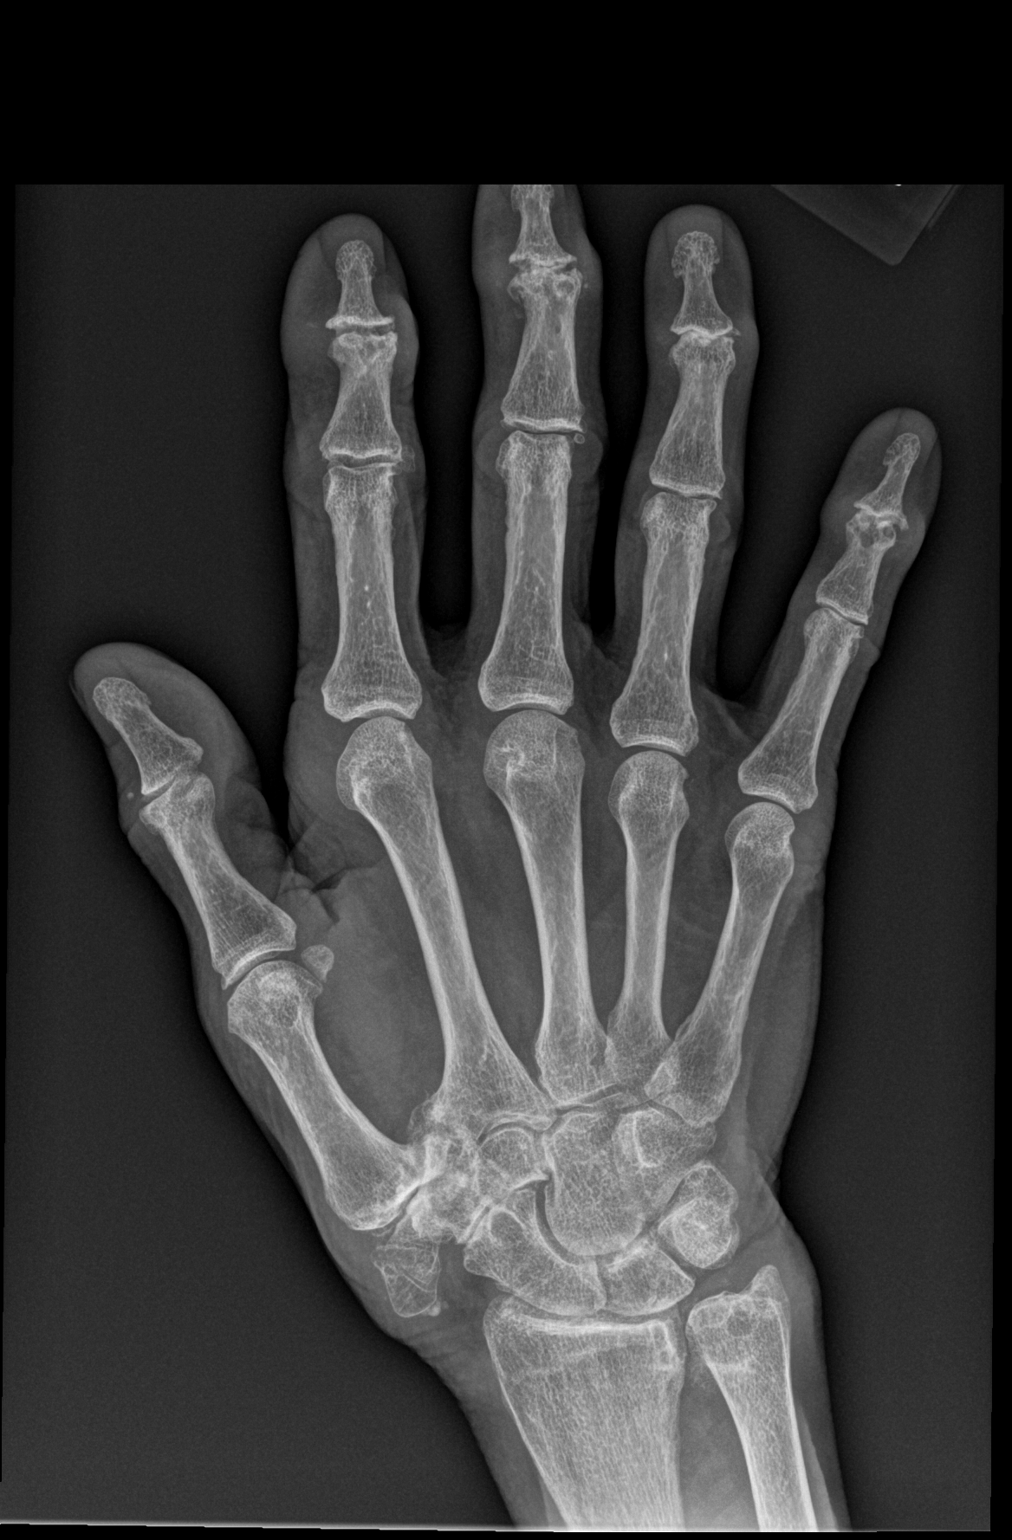

[x hand obl right]
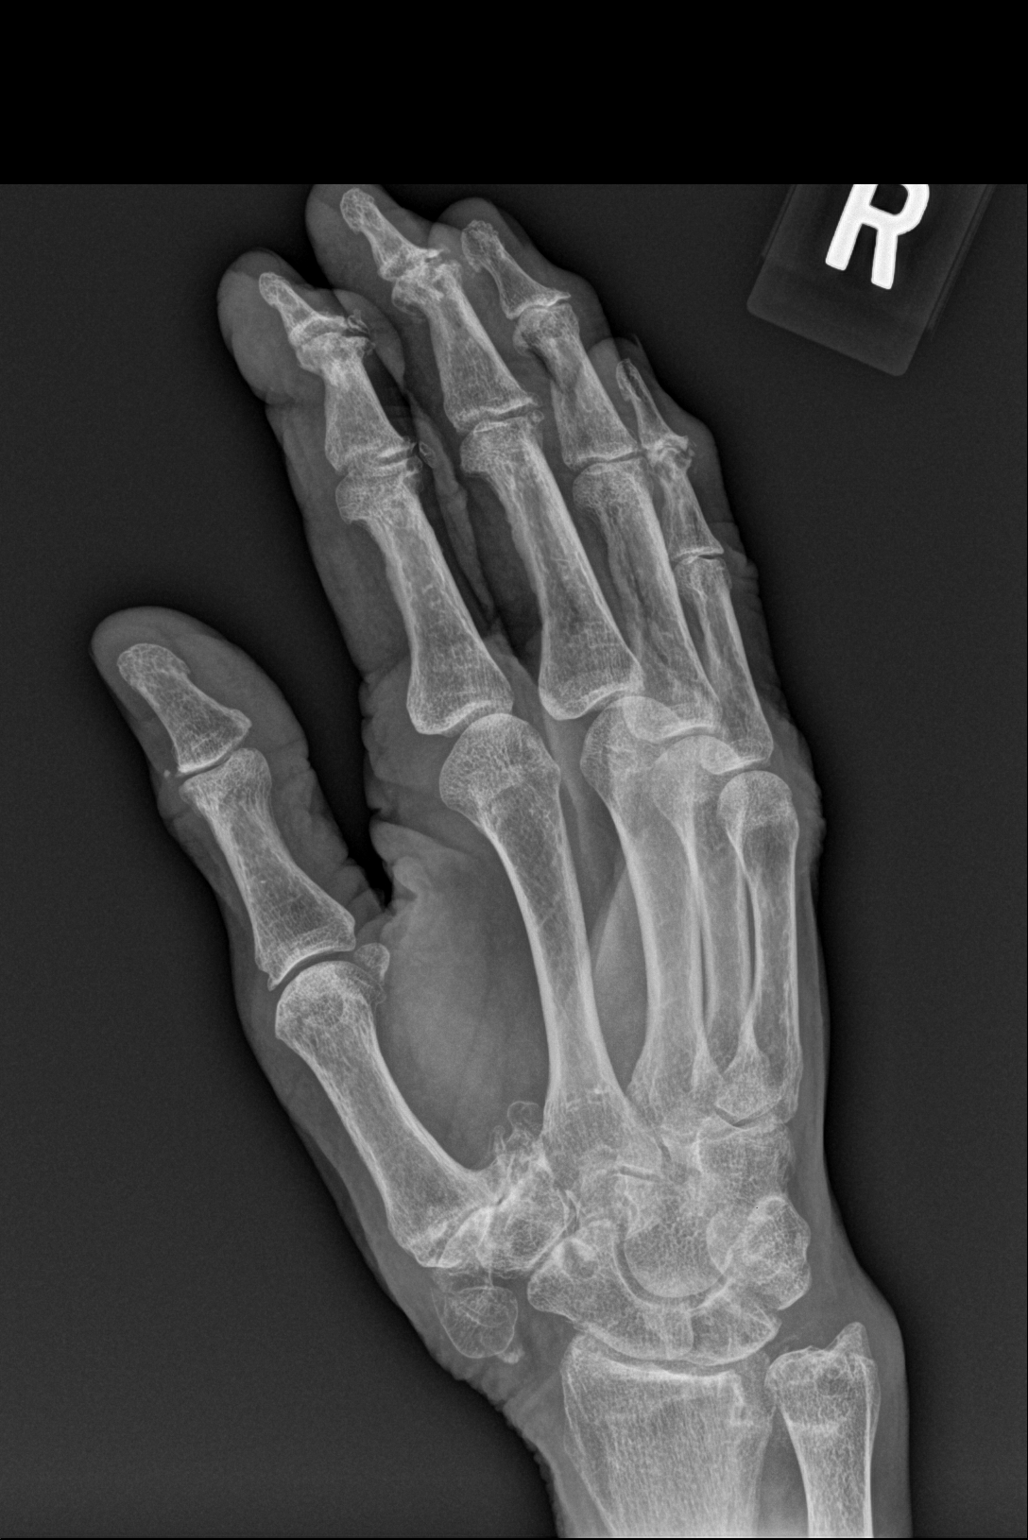

[x hand lat right]
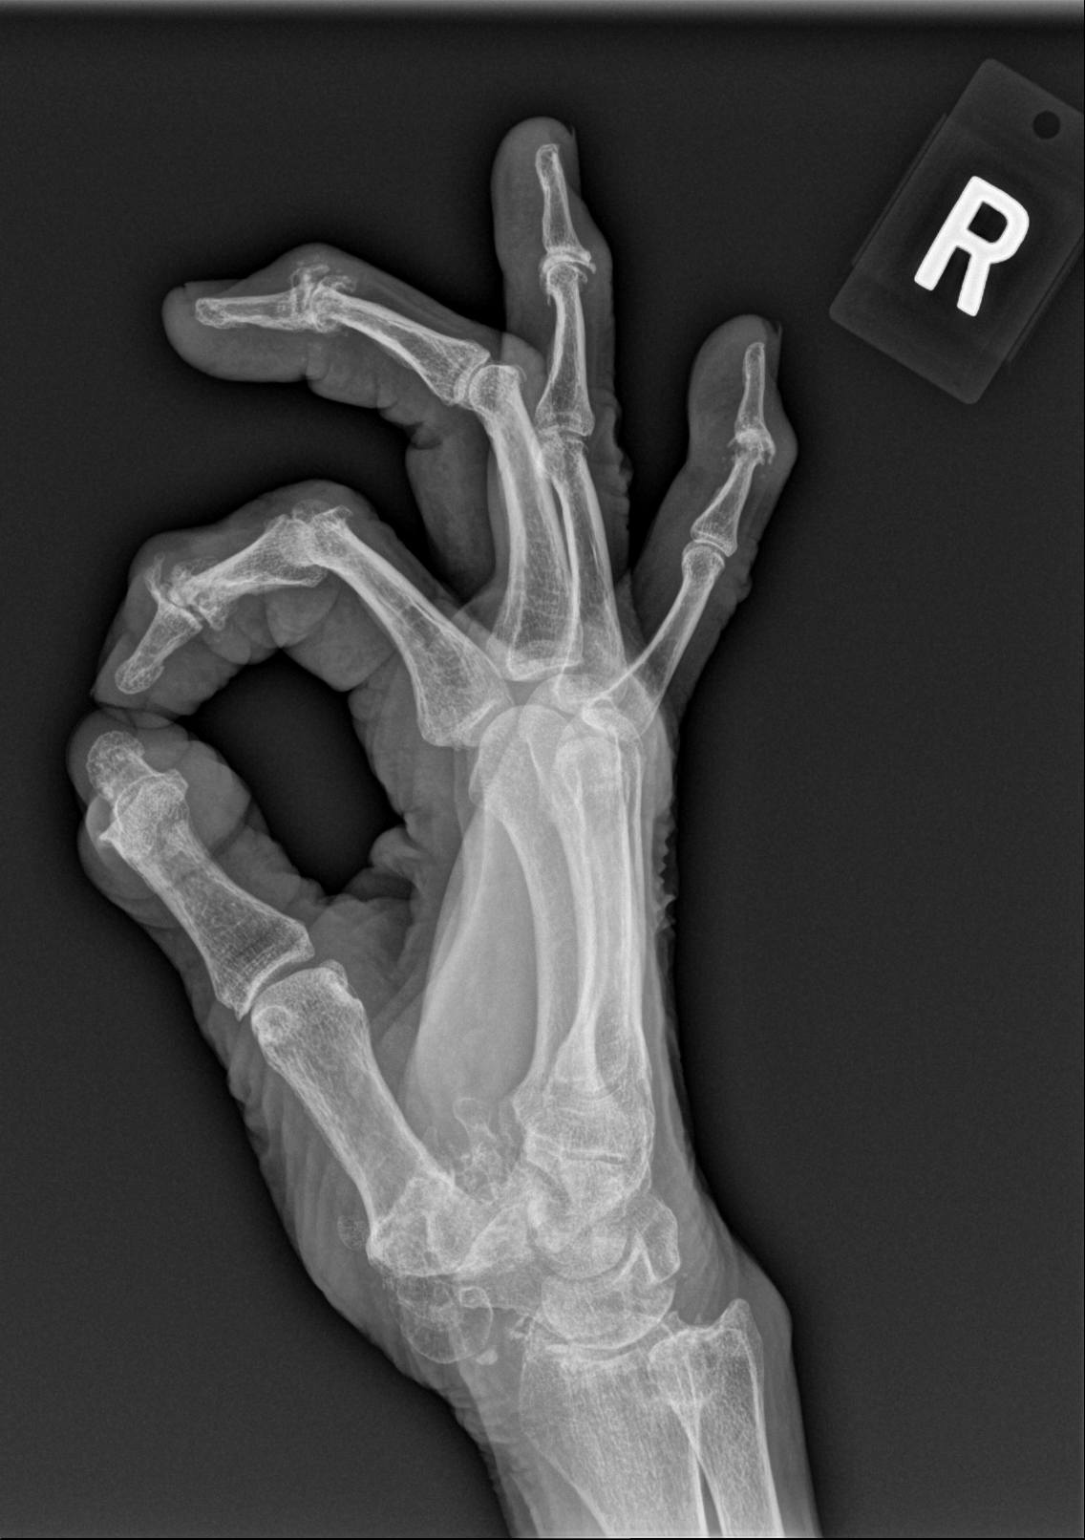

[3 of 3 positions shown; findings below may reference images not displayed]

FINDINGS: The patient has severe bilateral first CMC and scaphoid trapezium
trapezoid joint osteoarthritis with bone-on-bone joint space
narrowing, subchondral sclerosis and osteophytosis. Scattered
interphalangeal joint space narrowing and osteophytosis are also
seen and worst at the right index and long fingers. Osteoarthritis
at the distal radioulnar joints is worse on the right. No erosion or
periostitis. No fracture or dislocation. Chondrocalcinosis of the
triangular fibrocartilage is worse on the left.
IMPRESSION: Multifocal osteoarthritis is worst at the first CMC and STT joints
bilaterally and DIP joints of the right index and long fingers.

Bilateral TFC chondrocalcinosis.

## 2022-02-18 ENCOUNTER — Ambulatory Visit (INDEPENDENT_AMBULATORY_CARE_PROVIDER_SITE_OTHER): Payer: Medicare Other | Admitting: Family Medicine

## 2022-02-18 ENCOUNTER — Encounter: Payer: Self-pay | Admitting: Family Medicine

## 2022-02-18 VITALS — BP 130/85 | HR 70 | Ht 64.0 in | Wt 149.0 lb

## 2022-02-18 DIAGNOSIS — Z1239 Encounter for other screening for malignant neoplasm of breast: Secondary | ICD-10-CM

## 2022-02-18 DIAGNOSIS — Z1231 Encounter for screening mammogram for malignant neoplasm of breast: Secondary | ICD-10-CM

## 2022-02-18 DIAGNOSIS — N898 Other specified noninflammatory disorders of vagina: Secondary | ICD-10-CM

## 2022-02-18 DIAGNOSIS — Z01419 Encounter for gynecological examination (general) (routine) without abnormal findings: Secondary | ICD-10-CM | POA: Diagnosis not present

## 2022-02-18 DIAGNOSIS — M810 Age-related osteoporosis without current pathological fracture: Secondary | ICD-10-CM

## 2022-02-18 NOTE — Progress Notes (Signed)
01-28-21 Dexa 01-14-21 Mammography

## 2022-02-18 NOTE — Progress Notes (Addendum)
GYNECOLOGY ANNUAL PREVENTATIVE CARE ENCOUNTER NOTE  Subjective:   Erin Sampson is a 76 y.o. No obstetric history on file. female here for a routine annual gynecologic exam.  Current complaints: none.   Denies abnormal vaginal bleeding, discharge, pelvic pain, problems with intercourse or other gynecologic concerns.    Gynecologic History No LMP recorded. Patient has had a hysterectomy. Last mammogram: 2022. Results were: normal   The pregnancy intention screening data noted above was reviewed. Potential methods of contraception were discussed. The patient elected to proceed with No data recorded.   Obstetric History OB History  No obstetric history on file.    Past Medical History:  Diagnosis Date   Allergy    Bursitis    Depression    Diverticulitis    IBS (irritable bowel syndrome)     Past Surgical History:  Procedure Laterality Date   ABDOMINAL HYSTERECTOMY     for bleeding/polyps   APPENDECTOMY      Current Outpatient Medications on File Prior to Visit  Medication Sig Dispense Refill   aspirin 81 MG tablet Take 325 mg by mouth daily.     B Complex Vitamins (B COMPLEX 1 PO) B Complex     calcium carbonate (OS-CAL) 600 MG TABS tablet Take 600 mg by mouth 2 (two) times daily with a meal.     cholecalciferol (VITAMIN D) 1000 UNITS tablet Take 3,000 Units by mouth daily.     influenza vaccine adjuvanted (FLUAD) 0.5 ML injection Inject into the muscle. (Patient not taking: Reported on 05/26/2021) 0.5 mL 0   magnesium gluconate (MAGONATE) 500 MG tablet Take 500 mg by mouth 2 (two) times daily.     Turmeric 1053 MG TABS Take by mouth.     No current facility-administered medications on file prior to visit.    Allergies  Allergen Reactions   Codeine Itching   Clorox Nasal Antiseptic [Povidone-Iodine] Palpitations    Social History   Socioeconomic History   Marital status: Married    Spouse name: Not on file   Number of children: Not on file   Years of  education: Not on file   Highest education level: Not on file  Occupational History   Occupation: retired  Tobacco Use   Smoking status: Former    Packs/day: 1.00    Years: 20.00    Total pack years: 20.00    Types: Cigarettes    Quit date: 03/16/1983    Years since quitting: 38.9   Smokeless tobacco: Never   Tobacco comments:    Quit at age 21   Vaping Use   Vaping Use: Never used  Substance and Sexual Activity   Alcohol use: No   Drug use: No   Sexual activity: Not Currently  Other Topics Concern   Not on file  Social History Narrative   Married, with 2 children   2 yrs of college   Right handed   3-4 cups daily   Social Determinants of Health   Financial Resource Strain: Low Risk  (01/04/2022)   Overall Financial Resource Strain (CARDIA)    Difficulty of Paying Living Expenses: Not hard at all  Food Insecurity: No Food Insecurity (01/04/2022)   Hunger Vital Sign    Worried About Running Out of Food in the Last Year: Never true    [place] in the Last Year: Never true  Transportation Needs: No Transportation Needs (01/04/2022)   PRAPARE - Hydrologist (Medical):  No    Lack of Transportation (Non-Medical): No  Physical Activity: Inactive (01/04/2022)   Exercise Vital Sign    Days of Exercise per Week: 0 days    Minutes of Exercise per Session: 0 min  Stress: No Stress Concern Present (01/04/2022)   [place]    Feeling of Stress : Not at all  Social Connections: Moderately Isolated (01/04/2022)   Social Connection and Isolation Panel [NHANES]    Frequency of Communication with Friends and Family: More than three times a week    Frequency of Social Gatherings with Friends and Family: More than three times a week    Attends Religious Services: Never    Marine scientist or Organizations: No    Attends Archivist Meetings: Never    Marital Status: Married   Human resources officer Violence: Not At Risk (01/04/2022)   Humiliation, Afraid, Rape, and Kick questionnaire    Fear of Current or Ex-Partner: No    Emotionally Abused: No    Physically Abused: No    Sexually Abused: No    History reviewed. No pertinent family history.  The following portions of the patient's history were reviewed and updated as appropriate: allergies, current medications, past family history, past medical history, past social history, past surgical history and problem list.  Review of Systems Pertinent items are noted in HPI.   Objective:  BP 130/85   Pulse 70   Ht '5\' 4"'$  (1.626 m)   Wt 149 lb (67.6 kg)   BMI 25.58 kg/m  Wt Readings from Last 3 Encounters:  02/18/22 149 lb (67.6 kg)  01/04/22 150 lb (68 kg)  08/28/21 159 lb (72.1 kg)     Chaperone present during exam  CONSTITUTIONAL: Well-developed, well-nourished female in no acute distress.  HENT:  Normocephalic, atraumatic, External right and left ear normal. Oropharynx is clear and moist EYES: Conjunctivae and EOM are normal. Pupils are equal, round, and reactive to light. No scleral icterus.  NECK: Normal range of motion, supple, no masses.  Normal thyroid.   CARDIOVASCULAR: Normal heart rate noted, regular rhythm RESPIRATORY: Clear to auscultation bilaterally. Effort and breath sounds normal, no problems with respiration noted. BREASTS: Symmetric in size. No masses, skin changes, nipple drainage, or lymphadenopathy. ABDOMEN: Soft, normal bowel sounds, no distention noted.  No tenderness, rebound or guarding.  PELVIC: Atrophic appearing external genitalia; Atrophic appearing vaginal mucosa with a quarter sized erythemic area on the anterior vaginal wall. The vaginal lesion is not bleeding or painful to palpation. Uterus and adnexa surgically absent.  MUSCULOSKELETAL: Normal range of motion. No tenderness.  No cyanosis, clubbing, or edema.  2+ distal pulses. SKIN: Skin is warm and dry. No rash noted. Not  diaphoretic. No erythema. No pallor. NEUROLOGIC: Alert and oriented to person, place, and time. Normal reflexes, muscle tone coordination. No cranial nerve deficit noted. PSYCHIATRIC: Normal mood and affect. Normal behavior. Normal judgment and thought content.  Assessment:  Annual gynecologic examination with pap smear   Plan:  1. Well woman exam with routine gynecological exam  2. Breast cancer screening, high risk patient - MM DIGITAL SCREENING BILATERAL; Future  3. Encounter for screening mammogram for malignant neoplasm of breast - MM DIGITAL SCREENING BILATERAL; Future  4. Age-related osteoporosis without current pathological fracture On calcium/vitamin D. Did discuss Bisphosphonate use - patient hesitant about starting this. Information given. Has removed fall risk items from house. Last Dexa  12/2020 Spine -2.8 Femoral neck -3.2  5. Vaginal lesion Will have patient return for biopsy.   Routine preventative health maintenance measures emphasized. Please refer to After Visit Summary for other counseling recommendations.    Loma [place], [place] for [employer]

## 2022-02-22 ENCOUNTER — Ambulatory Visit (HOSPITAL_BASED_OUTPATIENT_CLINIC_OR_DEPARTMENT_OTHER)
Admission: RE | Admit: 2022-02-22 | Discharge: 2022-02-22 | Disposition: A | Discharge status: Home / Self Care | Payer: Medicare Other | Source: Ambulatory Visit | Attending: Family Medicine | Admitting: Family Medicine

## 2022-02-22 ENCOUNTER — Encounter (HOSPITAL_BASED_OUTPATIENT_CLINIC_OR_DEPARTMENT_OTHER): Payer: Self-pay

## 2022-02-22 DIAGNOSIS — Z1239 Encounter for other screening for malignant neoplasm of breast: Secondary | ICD-10-CM

## 2022-02-22 DIAGNOSIS — Z1231 Encounter for screening mammogram for malignant neoplasm of breast: Secondary | ICD-10-CM | POA: Diagnosis not present

## 2022-03-04 ENCOUNTER — Other Ambulatory Visit (HOSPITAL_COMMUNITY)
Admission: RE | Admit: 2022-03-04 | Discharge: 2022-03-04 | Disposition: A | Discharge status: Home / Self Care | Payer: Medicare Other | Source: Ambulatory Visit | Attending: Family Medicine | Admitting: Family Medicine

## 2022-03-04 ENCOUNTER — Encounter: Payer: Self-pay | Admitting: Family Medicine

## 2022-03-04 ENCOUNTER — Ambulatory Visit (INDEPENDENT_AMBULATORY_CARE_PROVIDER_SITE_OTHER): Payer: Medicare Other | Admitting: Family Medicine

## 2022-03-04 VITALS — BP 145/71 | HR 81 | Ht 64.0 in | Wt 149.0 lb

## 2022-03-04 DIAGNOSIS — N765 Ulceration of vagina: Secondary | ICD-10-CM | POA: Diagnosis not present

## 2022-03-04 DIAGNOSIS — N898 Other specified noninflammatory disorders of vagina: Secondary | ICD-10-CM | POA: Insufficient documentation

## 2022-03-04 NOTE — Progress Notes (Signed)
Patient presents for vaginal biopsy. Kathrene Alu RN

## 2022-03-04 NOTE — Progress Notes (Signed)
Patient Name: Erin Sampson, female   DOB: [birth year], 76 y.o.  MRN: [id number]  Patient seen for anterior vaginal wall lesion - seen with Dr Currie Paris. Appears more like the beginning of an ulceration, but unclear source of ulceration. No bleeding or friability. Offered to re-examine in 2-3 months. Patient would like to move forward with biopsy.  Hurricane spray used for anesthesia. Area cleaned with betadine x2. Biopsy done with Tischler forceps in 2 areas. Slight oozing noted from biopsy sight - hemostasis with silver nitrite done.  Patient recommended to return In 2-3 weeks for followup. Will call patient with results. Patient told to expect mild pressure/cramp. Can take ibuprofen and tylenol. Might have grey discharge and spotting. Call with light bleeding. If experiencing heavy bleeding, patient to go to ED as this would be unexpected.

## 2022-03-08 LAB — SURGICAL PATHOLOGY

## 2022-03-10 ENCOUNTER — Telehealth: Payer: Self-pay | Admitting: Family Medicine

## 2022-03-10 NOTE — Telephone Encounter (Signed)
Discussed biopsy results. Negative for malignancy - shows inflammation and ulceration. Will treat with vaginal estrogen. Patient doesn't have prescription insurance. Will give premarin sample - three times weekly for the next couple of months, then reexamin.

## 2022-03-10 NOTE — Telephone Encounter (Signed)
-----   Message from Maurine Minister, [place] sent at 03/10/2022 11:24 AM EDT ----- Regarding: Results Patient calling about biopsy results.

## 2022-03-15 ENCOUNTER — Telehealth: Payer: Self-pay

## 2022-03-15 NOTE — Telephone Encounter (Signed)
Patient called to advised per Dr. Nehemiah Settle to pick up premarin cream. [place] and [place] by to pick it up sometime early next week . Kathrene Alu RN

## 2022-03-22 IMAGING — CT CT RENAL STONE PROTOCOL
2 of 4 series · 16 of 46 positions shown, 18 images · non-contrast
Comparison: None.

CLINICAL DATA: Right flank pain and lower back pain, abdominal pain
Hysterectomy,appy

EXAM:
CT ABDOMEN AND PELVIS WITHOUT CONTRAST
TECHNIQUE: Multidetector CT imaging of the abdomen and pelvis was performed
following the standard protocol without IV contrast.

[Series 2: axial st · axial · 0.72mm/px · z∈[-422,-32]mm · 13 of 86 slices shown, 15 images]
[im 4/86  soft-tissue]
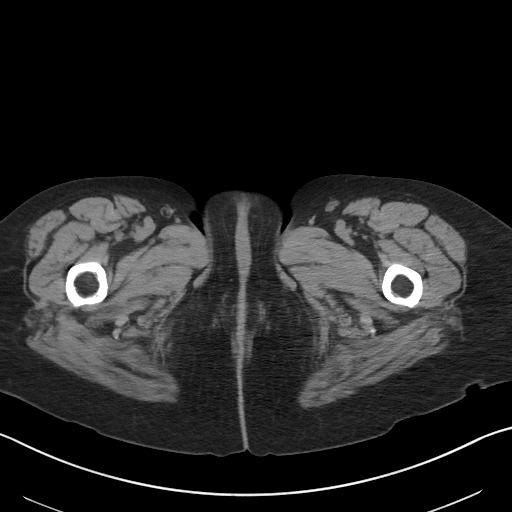
[im 4/86  bone]
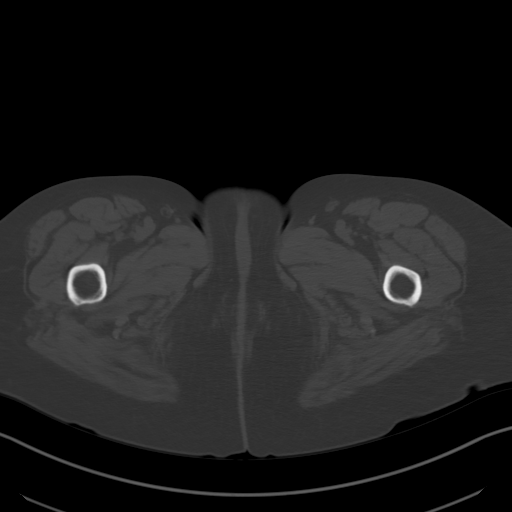
[im 11/86  soft-tissue]
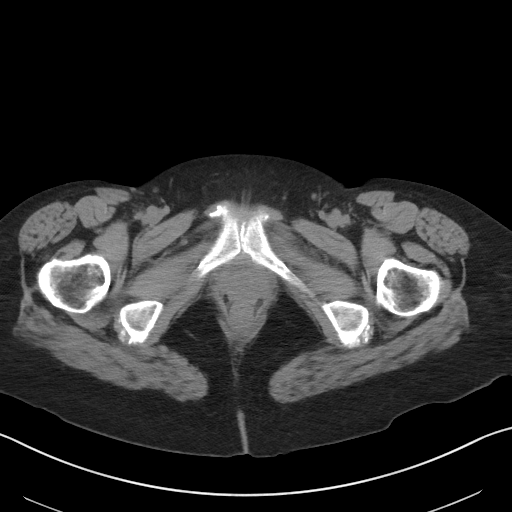
[im 18/86  soft-tissue]
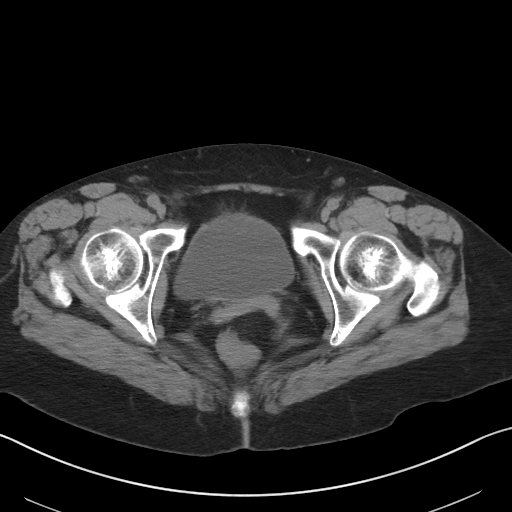
[im 25/86  soft-tissue]
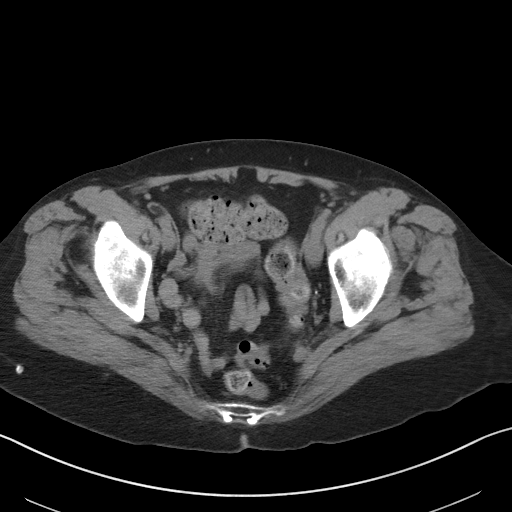
[im 29/86  soft-tissue]
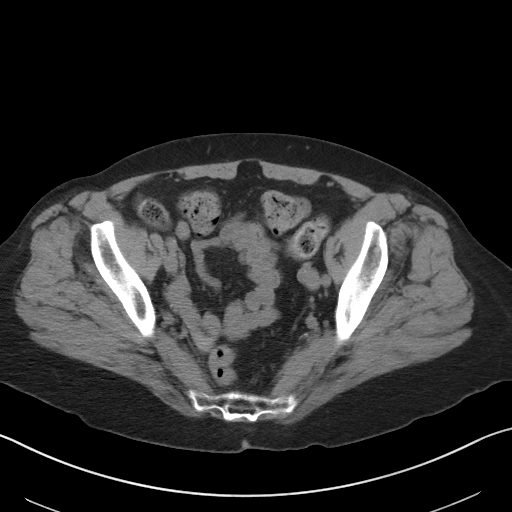
[im 36/86  soft-tissue]
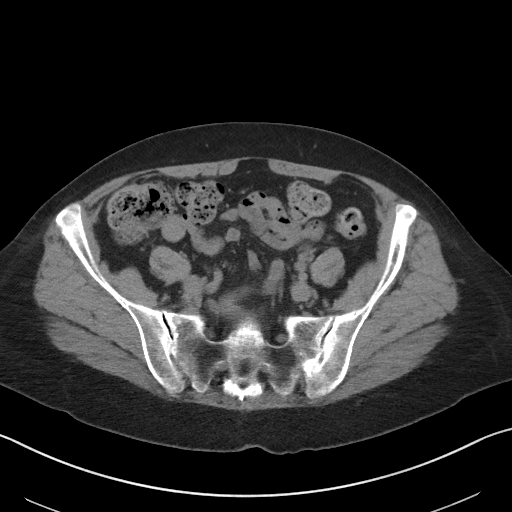
[im 43/86  soft-tissue]
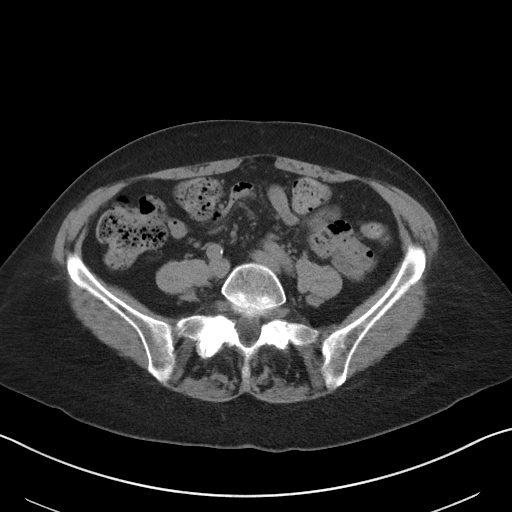
[im 50/86  soft-tissue]
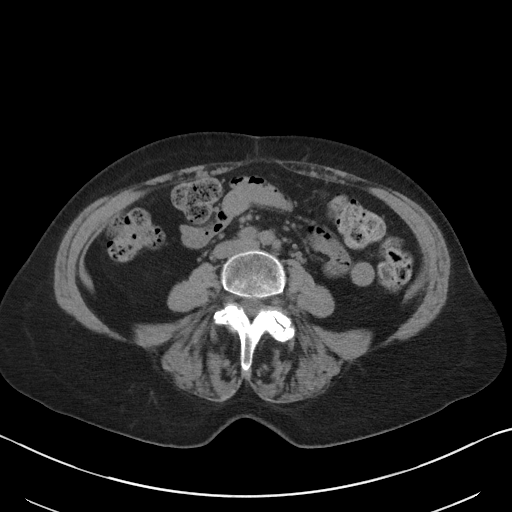
[im 57/86  soft-tissue]
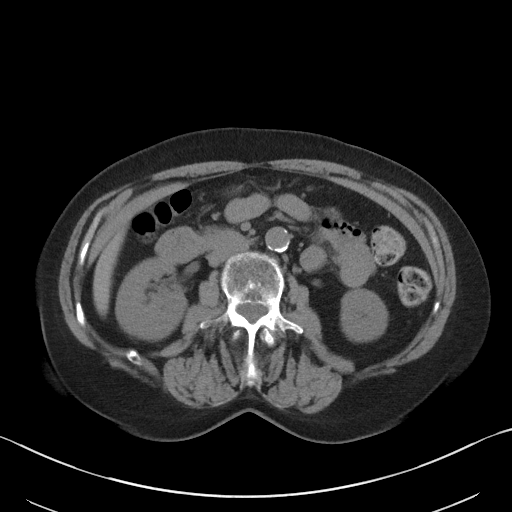
[im 57/86  bone]
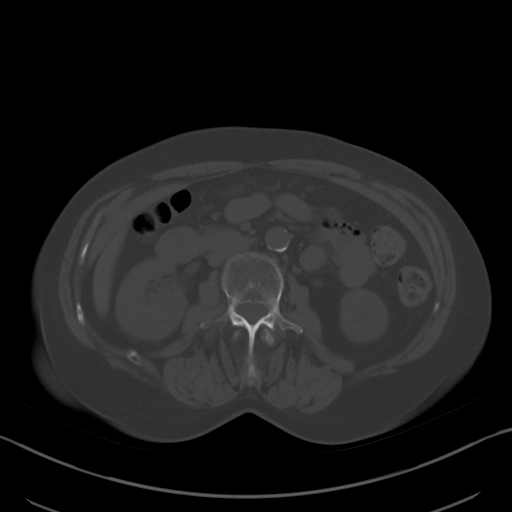
[im 61/86  soft-tissue]
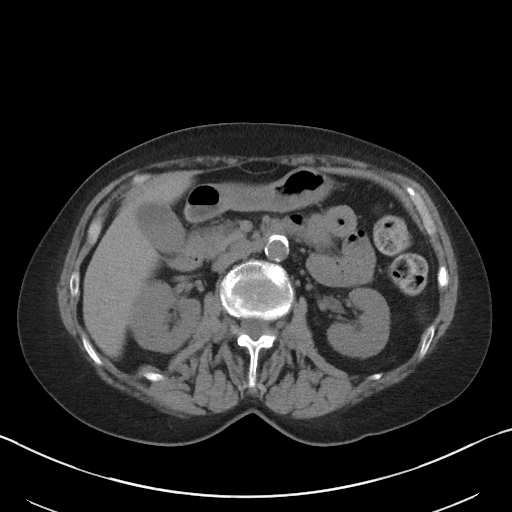
[im 68/86  soft-tissue]
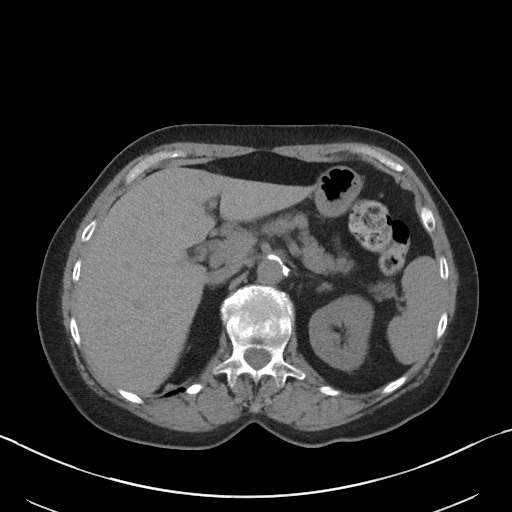
[im 75/86  soft-tissue]
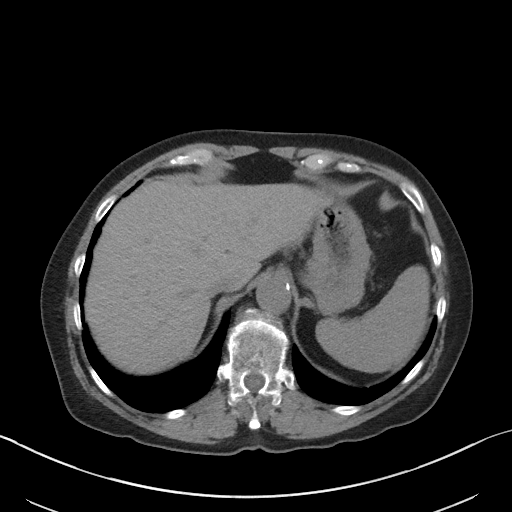
[im 82/86  soft-tissue]
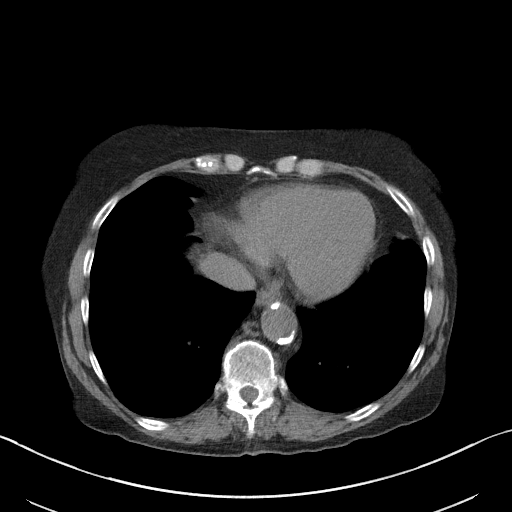

[Series 5: coronal st · coronal · 0.79mm/px · 3 of 96 slices shown]
[im 32/96  soft-tissue]
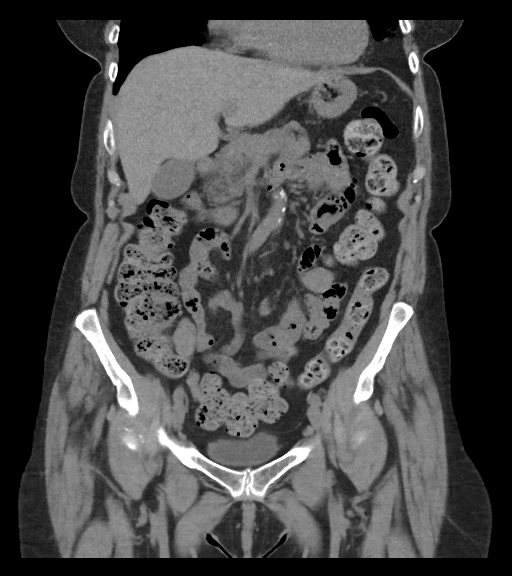
[im 43/96  soft-tissue]
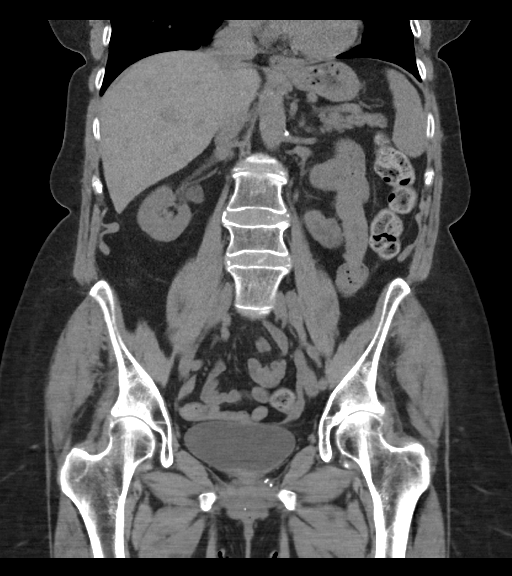
[im 53/96  soft-tissue]
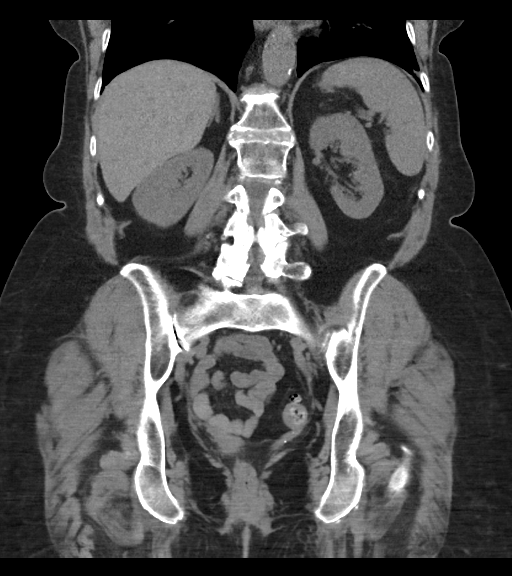

[16 of 46 positions shown; findings below may reference images not displayed]

FINDINGS: Lower chest: No acute abnormality.

Hepatobiliary: No focal liver abnormality is seen. No gallstones or
gallbladder wall thickening.

Pancreas: Unremarkable. No surrounding inflammatory changes.

Spleen: Normal in size without focal abnormality.

Adrenals/Urinary Tract: Adrenal glands are unremarkable. No renal
calculi. There is mild fullness of the proximal right renal
collecting system. No left hydronephrosis. No large renal masses.
Unremarkable appearance of the urinary bladder.

Stomach/Bowel: Stomach is within normal limits. No evidence of bowel
wall thickening, distention, or inflammatory changes. Colonic
diverticula without evidence of diverticulitis.

Vascular/Lymphatic: Aortic atherosclerosis. Vascular patency cannot
be assessed in the absence of IV contrast no enlarged abdominal or
pelvic lymph nodes.

Reproductive: Status post hysterectomy. No adnexal masses.

Other: No abdominal wall hernia or abnormality. No abdominopelvic
ascites.

Musculoskeletal: Likely chronic height loss at L3 and L4. Lower
lumbar facet hypertrophy.
IMPRESSION: 1. There is mild fullness of the proximal right renal collecting
system. No renal calculi are identified. Findings may represent a
recently passed stone.
2. Colonic diverticulosis without evidence of diverticulitis.
3. Likely chronic height loss at the L3 and L4 vertebral bodies.
4. Aortic atherosclerosis.

Aortic Atherosclerosis ([id number]).

## 2022-05-13 ENCOUNTER — Ambulatory Visit (INDEPENDENT_AMBULATORY_CARE_PROVIDER_SITE_OTHER): Payer: Medicare Other | Admitting: Family Medicine

## 2022-05-13 ENCOUNTER — Encounter: Payer: Self-pay | Admitting: Family Medicine

## 2022-05-13 VITALS — BP 137/70 | HR 73 | Wt 146.0 lb

## 2022-05-13 DIAGNOSIS — N898 Other specified noninflammatory disorders of vagina: Secondary | ICD-10-CM

## 2022-05-13 NOTE — Progress Notes (Signed)
   Subjective:    Patient ID: Erin Sampson, female    DOB: 04-Dec-1945, 76 y.o.   MRN: [id number]  HPI Patient seen for follow up for vaginal ulceration. I gave her premarin cream last time to see if that would help with healing. She tried the cream for 3-4 days, ended up with hot flashes and night sweats, so she stopped.   Review of Systems     Objective:   Physical Exam Vitals reviewed. Exam conducted with a chaperone present.  Constitutional:      Appearance: Normal appearance.  Abdominal:     Hernia: There is no hernia in the left inguinal area or right inguinal area.  Genitourinary:    General: Normal vulva.     Labia:        Right: No rash or tenderness.        Left: No rash or tenderness.      Comments: Normal atrophic vaginal mucosa. The previous ulceration was not seen today. Lymphadenopathy:     Lower Body: No right inguinal adenopathy. No left inguinal adenopathy.  Skin:    Capillary Refill: Capillary refill takes less than 2 seconds.  Neurological:     General: No focal deficit present.     Mental Status: She is alert.  Psychiatric:        Mood and Affect: Mood normal.        Behavior: Behavior normal.        Thought Content: Thought content normal.        Judgment: Judgment normal.        Assessment & Plan:  1. Vaginal lesion Appears to have resolved. No further intervention needed.

## 2022-06-03 ENCOUNTER — Other Ambulatory Visit (HOSPITAL_BASED_OUTPATIENT_CLINIC_OR_DEPARTMENT_OTHER): Payer: Self-pay

## 2022-06-03 DIAGNOSIS — Z23 Encounter for immunization: Secondary | ICD-10-CM | POA: Diagnosis not present

## 2022-06-03 MED ORDER — FLUAD QUADRIVALENT 0.5 ML IM PRSY
PREFILLED_SYRINGE | INTRAMUSCULAR | 0 refills | Status: DC
  Filled 2022-06-03: qty 0.5, 1d supply, fill #0

## 2022-10-11 DIAGNOSIS — D225 Melanocytic nevi of trunk: Secondary | ICD-10-CM | POA: Diagnosis not present

## 2022-10-11 DIAGNOSIS — L578 Other skin changes due to chronic exposure to nonionizing radiation: Secondary | ICD-10-CM | POA: Diagnosis not present

## 2022-10-11 DIAGNOSIS — L570 Actinic keratosis: Secondary | ICD-10-CM | POA: Diagnosis not present

## 2022-10-11 DIAGNOSIS — L821 Other seborrheic keratosis: Secondary | ICD-10-CM | POA: Diagnosis not present

## 2022-11-24 DIAGNOSIS — M25562 Pain in left knee: Secondary | ICD-10-CM | POA: Diagnosis not present

## 2022-11-24 DIAGNOSIS — M25561 Pain in right knee: Secondary | ICD-10-CM | POA: Diagnosis not present

## 2022-12-13 ENCOUNTER — Ambulatory Visit: Payer: Medicare Other | Admitting: Medical

## 2022-12-13 ENCOUNTER — Encounter: Payer: Self-pay | Admitting: Medical

## 2022-12-13 VITALS — BP 130/75 | HR 79 | Temp 97.9°F | Resp 18 | Ht 64.0 in | Wt 145.0 lb

## 2022-12-13 DIAGNOSIS — R5383 Other fatigue: Secondary | ICD-10-CM

## 2022-12-13 DIAGNOSIS — R0981 Nasal congestion: Secondary | ICD-10-CM

## 2022-12-13 DIAGNOSIS — R002 Palpitations: Secondary | ICD-10-CM | POA: Diagnosis not present

## 2022-12-13 MED ORDER — FLUTICASONE PROPIONATE 50 MCG/ACT NA SUSP: 2.0000 | Freq: Every day | NASAL | 1 refills | Status: DC

## 2022-12-13 NOTE — Patient Instructions (Addendum)
1. Fatigue, unspecified type Recommend hydrate well and follow labs to see if can find cause of recent fatigue. - Vitamin B12 - Vitamin B1 - TSH - T4, free - CBC w/Diff - Comp Met (CMET)  2. Palpitation Hold all caffeine and will place referral to cardiologist. If you get prolonged palpitation sensation pending cardiology referral then be seen in ED. - EKG 12-Lead(sinus rhythm with left axis deviation). Same as prior EKG in 2015.   3. Nasal congestion with some eustachian tube dysfunction. flonase nasal spray.  Follow up 7-10 days or sooner if needed.

## 2022-12-13 NOTE — Progress Notes (Addendum)
Subjective:    Patient ID: Erin Sampson, female    DOB: 17-Nov-1945, 77 y.o.   MRN: [id number]  HPI Pt states recently feeling very fatigued since Saturday. She felt like had to rest. She took 2 hour nap and felt better. But then later fatigue came on severely again.  Today waiting before I came in she had 3-4 minute palpitation sensation. No chest pain. Palpitation resolved by time I got to room.  On Saturday she felt chest palpitation and headache at that time before she had to lay down early Saturday.  Pt had heart monitor back in 2015. Was norma.  Pt drinks 2-3 cups of coffee a day.  Pt blood pressure has been intermittently mild higher than usual.    Over the weekend she took covid test and was negative.  Recent mild nasal congestion recently with mild left ear pressure intermittently.  Review of Systems  Constitutional:  Positive for fatigue. Negative for chills and fever.  Respiratory:  Negative for cough, chest tightness, shortness of breath and wheezing.   Cardiovascular:  Positive for palpitations. Negative for chest pain.  Gastrointestinal:  Negative for abdominal pain.  Musculoskeletal:  Negative for back pain.  Neurological:  Negative for dizziness, seizures, speech difficulty and headaches.  Hematological:  Negative for adenopathy. Does not bruise/bleed easily.  Psychiatric/Behavioral:  Negative for agitation, decreased concentration and dysphoric mood. The patient is nervous/anxious. The patient is not hyperactive.        Some increased anxiety recently.     Past Medical History:  Diagnosis Date   Allergy    Bursitis    Depression    Diverticulitis    IBS (irritable bowel syndrome)      Social History   Socioeconomic History   Marital status: Married    Spouse name: Not on file   Number of children: Not on file   Years of education: Not on file   Highest education level: Not on file  Occupational History   Occupation: retired  Tobacco Use    Smoking status: Former    Current packs/day: 0.00    Average packs/day: 1 pack/day for 20.0 years (20.0 ttl pk-yrs)    Types: Cigarettes    Start date: 03/16/1963    Quit date: 03/16/1983    Years since quitting: 39.7   Smokeless tobacco: Never   Tobacco comments:    Quit at age 45   Vaping Use   Vaping status: Never Used  Substance and Sexual Activity   Alcohol use: No   Drug use: No   Sexual activity: Not Currently  Other Topics Concern   Not on file  Social History Narrative   Married, with 2 children   2 yrs of college   Right handed   3-4 cups daily   Social Determinants of Health   Financial Resource Strain: Low Risk  (01/04/2022)   Overall Financial Resource Strain (CARDIA)    Difficulty of Paying Living Expenses: Not hard at all  Food Insecurity: No Food Insecurity (01/04/2022)   Hunger Vital Sign    Worried About Running Out of Food in the Last Year: Never true    Ran Out of Food in the Last Year: Never true  Transportation Needs: No Transportation Needs (01/04/2022)   PRAPARE - Administrator, Civil Service (Medical): No    Lack of Transportation (Non-Medical): No  Physical Activity: Inactive (01/04/2022)   Exercise Vital Sign    Days of Exercise per Week: 0 days  Minutes of Exercise per Session: 0 min  Stress: No Stress Concern Present (01/04/2022)   [employer] of Occupational Health - Occupational Stress Questionnaire    Feeling of Stress : Not at all  Social Connections: Moderately Isolated (01/04/2022)   Social Connection and Isolation Panel [NHANES]    Frequency of Communication with Friends and Family: More than three times a week    Frequency of Social Gatherings with Friends and Family: More than three times a week    Attends Religious Services: Never    Database administrator or Organizations: No    Attends Banker Meetings: Never    Marital Status: Married  Catering manager Violence: Not At Risk (01/04/2022)   Humiliation,  Afraid, Rape, and Kick questionnaire    Fear of Current or Ex-Partner: No    Emotionally Abused: No    Physically Abused: No    Sexually Abused: No    Past Surgical History:  Procedure Laterality Date   ABDOMINAL HYSTERECTOMY     for bleeding/polyps   APPENDECTOMY      No family history on file.  Allergies  Allergen Reactions   Codeine Itching   Clorox Nasal Antiseptic [Povidone-Iodine] Palpitations    Current Outpatient Medications on File Prior to Visit  Medication Sig Dispense Refill   aspirin 81 MG tablet Take 325 mg by mouth daily.     B Complex Vitamins (B COMPLEX 1 PO) B Complex     calcium carbonate (OS-CAL) 600 MG TABS tablet Take 600 mg by mouth 2 (two) times daily with a meal.     cholecalciferol (VITAMIN D) 1000 UNITS tablet Take 3,000 Units by mouth daily.     magnesium gluconate (MAGONATE) 500 MG tablet Take 500 mg by mouth 2 (two) times daily.     Turmeric 1053 MG TABS Take by mouth.     No current facility-administered medications on file prior to visit.    BP 130/75   Pulse 79   Temp 97.9 F (36.6 C)   Resp 18   Ht 5\' 4"  (1.626 m)   Wt 145 lb (65.8 kg)   SpO2 99%   BMI 24.89 kg/m           Objective:   Physical Exam   General Mental Status- Alert. General Appearance- Not in acute distress.   Skin General: Color- Normal Color. Moisture- Normal Moisture.  Neck Carotid Arteries- Normal color. Moisture- Normal Moisture. No carotid bruits. No JVD.  Chest and Lung Exam Auscultation: Breath Sounds:-Normal.  Cardiovascular Auscultation:Rythm- Regular. Murmurs & Other Heart Sounds:Auscultation of the heart reveals- No Murmurs.  Abdomen Inspection:-Inspeection Normal. Palpation/Percussion:Note:No mass. Palpation and Percussion of the abdomen reveal- Non Tender, Non Distended + BS, no rebound or guarding.    Neurologic Cranial Nerve exam:- CN III-XII intact(No nystagmus), symmetric smile. Drift Test:- No drift. Finger to Nose:-  Normal/Intact Strength:- 5/5 equal and symmetric strength both upper and lower extremities.      Assessment & Plan:   Patient Instructions  1. Fatigue, unspecified type Recommend hydrate well and follow labs to see if can find cause of recent fatigue. - Vitamin B12 - Vitamin B1 - TSH - T4, free - CBC w/Diff - Comp Met (CMET)  2. Palpitation Hold all caffeine and will place referral to cardiologist. If you get prolonged palpitation sensation pending cardiology referral then be seen in ED. - EKG 12-Lead  3. Nasal congestion with some eustachian tube dysfunction. flonase nasal spray.  Follow up 7-10 days or  sooner if needed.

## 2022-12-14 ENCOUNTER — Encounter: Payer: Self-pay | Admitting: Medical

## 2022-12-14 LAB — CBC WITH DIFFERENTIAL/PLATELET
Basophils Absolute: 0.1 10*3/uL (ref 0.0–0.1)
Basophils Relative: 1 % (ref 0.0–3.0)
Eosinophils Absolute: 0 10*3/uL (ref 0.0–0.7)
Eosinophils Relative: 0.4 % (ref 0.0–5.0)
HCT: 41.8 % (ref 36.0–46.0)
Hemoglobin: 14.1 g/dL (ref 12.0–15.0)
Lymphocytes Relative: 18.3 % (ref 12.0–46.0)
Lymphs Abs: 1.4 10*3/uL (ref 0.7–4.0)
MCHC: 33.7 g/dL (ref 30.0–36.0)
MCV: 90.7 fl (ref 78.0–100.0)
Monocytes Absolute: 0.4 10*3/uL (ref 0.1–1.0)
Monocytes Relative: 5.5 % (ref 3.0–12.0)
Neutro Abs: 5.6 10*3/uL (ref 1.4–7.7)
Neutrophils Relative %: 74.8 % (ref 43.0–77.0)
Platelets: 316 10*3/uL (ref 150.0–400.0)
RBC: 4.61 Mil/uL (ref 3.87–5.11)
RDW: 13.2 % (ref 11.5–15.5)
WBC: 7.5 10*3/uL (ref 4.0–10.5)

## 2022-12-14 LAB — COMPREHENSIVE METABOLIC PANEL
ALT: 15 U/L (ref 0–35)
AST: 15 U/L (ref 0–37)
Albumin: 4.4 g/dL (ref 3.5–5.2)
Alkaline Phosphatase: 68 U/L (ref 39–117)
BUN: 10 mg/dL (ref 6–23)
CO2: 27 mEq/L (ref 19–32)
Calcium: 10.1 mg/dL (ref 8.4–10.5)
Chloride: 94 mEq/L — ABNORMAL LOW (ref 96–112)
Creatinine, Ser: 0.72 mg/dL (ref 0.40–1.20)
GFR: 79.9 mL/min (ref >60.00)
Glucose, Bld: 100 mg/dL — ABNORMAL HIGH (ref 70–99)
Potassium: 5.2 mEq/L — ABNORMAL HIGH (ref 3.5–5.1)
Sodium: 130 mEq/L — ABNORMAL LOW (ref 135–145)
Total Bilirubin: 0.6 mg/dL (ref 0.2–1.2)
Total Protein: 6.9 g/dL (ref 6.0–8.3)

## 2022-12-14 LAB — VITAMIN B12: Vitamin B-12: 934 pg/mL — ABNORMAL HIGH (ref 211–911)

## 2022-12-14 LAB — TSH: TSH: 1.41 u[IU]/mL (ref 0.35–5.50)

## 2022-12-14 LAB — T4, FREE: Free T4: 0.92 ng/dL (ref 0.60–1.60)

## 2022-12-18 LAB — VITAMIN B1: Vitamin B1 (Thiamine): 24 nmol/L (ref 8–30)

## 2022-12-30 ENCOUNTER — Encounter: Payer: Self-pay | Admitting: Family Medicine

## 2022-12-30 ENCOUNTER — Ambulatory Visit (INDEPENDENT_AMBULATORY_CARE_PROVIDER_SITE_OTHER): Payer: Medicare Other | Admitting: Family Medicine

## 2022-12-30 VITALS — BP 117/77 | HR 88 | Temp 97.9°F | Resp 16 | Ht 64.0 in | Wt 146.2 lb

## 2022-12-30 DIAGNOSIS — J0140 Acute pansinusitis, unspecified: Secondary | ICD-10-CM | POA: Diagnosis not present

## 2022-12-30 DIAGNOSIS — R5383 Other fatigue: Secondary | ICD-10-CM

## 2022-12-30 LAB — POC URINALSYSI DIPSTICK (AUTOMATED)
Bilirubin, UA: NEGATIVE
Blood, UA: NEGATIVE
Glucose, UA: NEGATIVE
Ketones, UA: NEGATIVE
Leukocytes, UA: NEGATIVE
Nitrite, UA: NEGATIVE
Protein, UA: NEGATIVE
Spec Grav, UA: <=1.005 — AB (ref 1.010–1.025)
Urobilinogen, UA: 0.2 E.U./dL
pH, UA: 5 (ref 5.0–8.0)

## 2022-12-30 MED ORDER — AMOXICILLIN-POT CLAVULANATE 875-125 MG PO TABS: 1.0000 | ORAL_TABLET | Freq: Two times a day (BID) | ORAL | 0 refills | Status: DC

## 2022-12-30 NOTE — Progress Notes (Signed)
Acute Office Visit  Subjective:     Patient ID: Erin Sampson, female    DOB: June 12, 1945, 77 y.o.   MRN: [id number]  Chief Complaint  Patient presents with   Sinusitis    X1 week, Facial pain under and over eyes, headache, ear stuffiness, denies fever, has not done covid test     Patient is in today for sinusitis and fatigue.   Discussed the use of AI scribe software for clinical note transcription with the patient, who gave verbal consent to proceed.  History of Present Illness   The patient presents with a week-long history of upper respiratory sinus symptoms, characterized by facial pain, headache, and occasional ear discomfort. The pain is primarily located in the head and face, with the cheekbones being particularly affected. The patient also reports a new onset of upper dental pain, particularly on the right, which led them to suspect a sinus infection based on past experiences.  The patient denies any significant dental issues and has not been around anyone who has been sick. They have been managing the pain with over-the-counter Advil, and deny any fever, trouble breathing, chest pain, nausea, vomiting, or diarrhea.  In addition to the sinus symptoms, the patient reports increased urinary frequency, waking up four times a night to urinate.   The patient also mentions a recent visit to the doctor for fatigue and chest palpitations, which resulted in a referral to a cardiologist. However, the patient does not believe these symptoms are related to the current sinus and urinary symptoms.  The patient's primary concern is the persistent head and facial pain, which has been worsening and reached its peak the night before the consultation. The patient also reports a slightly runny nose, but denies any significant coughing.           ROS All review of systems negative except what is listed in the HPI      Objective:    BP 117/77   Pulse 88   Temp 97.9 F (36.6 C)  (Oral)   Resp 16   Ht 5\' 4"  (1.626 m)   Wt 146 lb 4 oz (66.3 kg)   SpO2 100%   BMI 25.10 kg/m    Physical Exam Vitals reviewed.  Constitutional:      Appearance: Normal appearance.  HENT:     Head: Normocephalic and atraumatic.     Right Ear: Tympanic membrane normal.     Left Ear: Tympanic membrane normal.     Nose: Rhinorrhea present. No congestion.  Cardiovascular:     Rate and Rhythm: Normal rate and regular rhythm.     Heart sounds: Normal heart sounds.  Pulmonary:     Effort: Pulmonary effort is normal.     Breath sounds: Normal breath sounds.  Musculoskeletal:     Cervical back: Normal range of motion and neck supple. No tenderness.  Lymphadenopathy:     Cervical: No cervical adenopathy.  Skin:    General: Skin is warm and dry.  Neurological:     Mental Status: She is alert and oriented to person, place, and time.  Psychiatric:        Mood and Affect: Mood normal.        Behavior: Behavior normal.        Thought Content: Thought content normal.        Judgment: Judgment normal.     Results for orders placed or performed in visit on 12/30/22  POCT Urinalysis Dipstick (Automated)  Result Value  Ref Range   Color, UA Yellow    Clarity, UA Cloudy    Glucose, UA Negative Negative   Bilirubin, UA Negative    Ketones, UA Negative    Spec Grav, UA <=1.005 (A) 1.010 - 1.025   Blood, UA Negative    pH, UA 5.0 5.0 - 8.0   Protein, UA Negative Negative   Urobilinogen, UA 0.2 0.2 or 1.0 E.U./dL   Nitrite, UA Negative    Leukocytes, UA Negative Negative        Assessment & Plan:   Problem List Items Addressed This Visit   None Visit Diagnoses     Acute non-recurrent pansinusitis    -  Primary   Relevant Medications   amoxicillin-clavulanate (AUGMENTIN) 875-125 MG tablet   Fatigue, unspecified type       Relevant Orders   POCT Urinalysis Dipstick (Automated) (Completed)      Urine sample was normal Adding antibiotics for sinusitis. Take with  food/water and consider adding a probiotic (2 hours apart from antibiotic).  Continue supportive measures including rest, hydration, humidifier use, steam showers, warm compresses to sinuses, warm liquids with lemon and honey, and over-the-counter cough, cold, and analgesics as needed.     Meds ordered this encounter  Medications   amoxicillin-clavulanate (AUGMENTIN) 875-125 MG tablet    Sig: Take 1 tablet by mouth 2 (two) times daily.    Dispense:  20 tablet    Refill:  0    Order Specific Question:   Supervising Provider    Answer:   Danise Edge A [[id number]]    Return if symptoms worsen or fail to improve.  Clayborne Dana, NP

## 2022-12-30 NOTE — Patient Instructions (Addendum)
Urine sample was normal Adding antibiotics for sinusitis. Take with food/water and consider adding a probiotic (2 hours apart from antibiotic).  Continue supportive measures including rest, hydration, humidifier use, steam showers, warm compresses to sinuses, warm liquids with lemon and honey, and over-the-counter cough, cold, and analgesics as needed.   Please contact office for follow-up if symptoms do not improve or worsen. Seek emergency care if symptoms become severe.

## 2022-12-31 ENCOUNTER — Encounter: Payer: Self-pay | Admitting: Cardiology

## 2022-12-31 DIAGNOSIS — E2839 Other primary ovarian failure: Secondary | ICD-10-CM | POA: Insufficient documentation

## 2022-12-31 DIAGNOSIS — N952 Postmenopausal atrophic vaginitis: Secondary | ICD-10-CM | POA: Insufficient documentation

## 2022-12-31 DIAGNOSIS — E041 Nontoxic single thyroid nodule: Secondary | ICD-10-CM

## 2022-12-31 DIAGNOSIS — F341 Dysthymic disorder: Secondary | ICD-10-CM | POA: Insufficient documentation

## 2022-12-31 HISTORY — DX: Nontoxic single thyroid nodule: E04.1

## 2023-01-03 ENCOUNTER — Telehealth: Payer: Self-pay | Admitting: Medical

## 2023-01-03 NOTE — Telephone Encounter (Signed)
Copied from CRM [id number]. Topic: Medicare AWV >> Jan 03, 2023  3:28 PM Payton Doughty wrote: Reason for CRM: LVM to sched AWV 01/03/2023  Verlee Rossetti; Care Guide Ambulatory Clinical Support [place] [facility] Direct Dial: [phone]

## 2023-01-04 ENCOUNTER — Ambulatory Visit: Payer: Medicare Other | Attending: Cardiology | Admitting: Cardiology

## 2023-01-04 ENCOUNTER — Encounter: Payer: Self-pay | Admitting: Cardiology

## 2023-01-04 ENCOUNTER — Ambulatory Visit: Payer: Medicare Other | Attending: Cardiology

## 2023-01-04 VITALS — BP 134/72 | HR 68 | Ht 67.0 in | Wt 147.0 lb

## 2023-01-04 DIAGNOSIS — Z8673 Personal history of transient ischemic attack (TIA), and cerebral infarction without residual deficits: Secondary | ICD-10-CM | POA: Diagnosis not present

## 2023-01-04 DIAGNOSIS — R0609 Other forms of dyspnea: Secondary | ICD-10-CM | POA: Diagnosis not present

## 2023-01-04 DIAGNOSIS — E785 Hyperlipidemia, unspecified: Secondary | ICD-10-CM | POA: Diagnosis not present

## 2023-01-04 DIAGNOSIS — R002 Palpitations: Secondary | ICD-10-CM | POA: Diagnosis not present

## 2023-01-04 DIAGNOSIS — G459 Transient cerebral ischemic attack, unspecified: Secondary | ICD-10-CM | POA: Insufficient documentation

## 2023-01-04 HISTORY — DX: Hyperlipidemia, unspecified: E78.5

## 2023-01-04 NOTE — Patient Instructions (Addendum)
Medication Instructions:  Your physician recommends that you continue on your current medications as directed. Please refer to the Current Medication list given to you today.  *If you need a refill on your cardiac medications before your next appointment, please call your pharmacy*   Lab Work: 3rd Floor [address] Lipid panel- today If you have labs (blood work) drawn today and your tests are completely normal, you will receive your results only by: MyChart Message (if you have MyChart) OR A paper copy in the mail If you have any lab test that is abnormal or we need to change your treatment, we will call you to review the results.   Testing/Procedures: Your physician has requested that you have an echocardiogram. Echocardiography is a painless test that uses sound waves to create images of your heart. It provides your doctor with information about the size and shape of your heart and how well your heart's chambers and valves are working. This procedure takes approximately one hour. There are no restrictions for this procedure. Please do NOT wear cologne, perfume, aftershave, or lotions (deodorant is allowed). Please arrive 15 minutes prior to your appointment time.    WHY IS MY DOCTOR PRESCRIBING ZIO? The Zio system is proven and trusted by physicians to detect and diagnose irregular heart rhythms -- and has been prescribed to hundreds of thousands of patients.  The FDA has cleared the Zio system to monitor for many different kinds of irregular heart rhythms. In a study, physicians were able to reach a diagnosis 90% of the time with the Zio system1.  You can wear the Zio monitor -- a small, discreet, comfortable patch -- during your normal day-to-day activity, including while you sleep, shower, and exercise, while it records every single heartbeat for analysis.  1Barrett, P., et al. Comparison of 24 Hour Holter Monitoring Versus 14 Day Novel Adhesive Patch Electrocardiographic Monitoring.  American Journal of Medicine, 2014.  ZIO VS. HOLTER MONITORING The Zio monitor can be comfortably worn for up to 14 days. Holter monitors can be worn for 24 to 48 hours, limiting the time to record any irregular heart rhythms you may have. Zio is able to capture data for the 51% of patients who have their first symptom-triggered arrhythmia after 48 hours.1  LIVE WITHOUT RESTRICTIONS The Zio ambulatory cardiac monitor is a small, unobtrusive, and water-resistant patch--you might even forget you're wearing it. The Zio monitor records and stores every beat of your heart, whether you're sleeping, working out, or showering.     Follow-Up: At [facility], you and your health needs are our priority.  As part of our continuing mission to provide you with exceptional heart care, we have created designated Provider Care Teams.  These Care Teams include your primary Cardiologist (physician) and Advanced Practice Providers (APPs -  Physician Assistants and Nurse Practitioners) who all work together to provide you with the care you need, when you need it.  We recommend signing up for the patient portal called "MyChart".  Sign up information is provided on this After Visit Summary.  MyChart is used to connect with patients for Virtual Visits (Telemedicine).  Patients are able to view lab/test results, encounter notes, upcoming appointments, etc.  Non-urgent messages can be sent to your provider as well.   To learn more about what you can do with MyChart, go to [email or web address].    Your next appointment:   2 month(s)  The format for your next appointment:   In Person  Provider:  Gypsy Balsam, MD    Other Instructions NA

## 2023-01-04 NOTE — Progress Notes (Unsigned)
Cardiology Consultation:    Date:  01/04/2023   ID:  Erin Sampson, DOB Feb 01, 1946, MRN [id number]  PCP:  Erin Richters, PA-C  Cardiologist:  Gypsy Balsam, MD   Referring MD: Erin Sampson, [place]   Chief Complaint  Patient presents with   rapid HR    Ongoing since 11/2022  Palpitations  History of Present Illness:    Erin Sampson is a 77 y.o. female who is being seen today for the evaluation of palpitations at the request of [name], Erin Sampson, [place].  Past medical history significant for dyslipidemia, thyroid nodule, irritable bowel syndrome in 2015 she did have episode of TIA, after that workup included monitor which was unrevealing and echocardiogram which was normal she was referred to [place] because she started having palpitations few weeks ago she got situation her heart starts beating up out of blue lasting for few minutes she got no swelling no sweating no shortness of breath no dizziness no passing out and that she developed the sensation then abrupt termination of it since that time she described may be some short episodes but nothing sustained like previously.  She is very active she take care of her children grandchildren she walks around with them.  She does not exercise on the regular basis.  Denies having any chest pain tightness squeezing pressure burning chest.  Does have some shortness of breath with exertion.  Quit smoking years ago does have a family history of aneurysm apparently her father died at the age of 38 because of aneurysm.  Past Medical History:  Diagnosis Date   Allergy    Bursitis    Depression    Diverticulitis    Dyslipidemia 01/04/2023   IBS (irritable bowel syndrome)    Thyroid nodule 12/31/2022    Past Surgical History:  Procedure Laterality Date   ABDOMINAL HYSTERECTOMY     for bleeding/polyps   APPENDECTOMY      Current Medications: Current Meds  Medication Sig   amoxicillin-clavulanate (AUGMENTIN) 875-125 MG tablet Take 1 tablet by  mouth 2 (two) times daily. (Patient taking differently: Take 1 tablet by mouth 2 (two) times daily. Started 12/30/2022)   aspirin 81 MG tablet Take 325 mg by mouth daily.   B Complex Vitamins (B COMPLEX 1 PO) Take 1 tablet by mouth daily.   calcium carbonate (OS-CAL) 600 MG TABS tablet Take 600 mg by mouth 2 (two) times daily with a meal.   cholecalciferol (VITAMIN D) 1000 UNITS tablet Take 3,000 Units by mouth daily.   magnesium gluconate (MAGONATE) 500 MG tablet Take 500 mg by mouth 2 (two) times daily.   Turmeric 1053 MG TABS Take 1 tablet by mouth daily.   [DISCONTINUED] fluticasone (FLONASE) 50 MCG/ACT nasal spray Place 2 sprays into both nostrils daily.     Allergies:   Codeine and Other   Social History   Socioeconomic History   Marital status: Married    Spouse name: Not on file   Number of children: Not on file   Years of education: Not on file   Highest education level: Not on file  Occupational History   Occupation: retired  Tobacco Use   Smoking status: Former    Current packs/day: 0.00    Average packs/day: 1 pack/day for 20.0 years (20.0 ttl pk-yrs)    Types: Cigarettes    Start date: 03/16/1963    Quit date: 03/16/1983    Years since quitting: 39.8   Smokeless tobacco: Never   Tobacco comments:  Quit at age 1   Vaping Use   Vaping status: Never Used  Substance and Sexual Activity   Alcohol use: No   Drug use: No   Sexual activity: Not Currently  Other Topics Concern   Not on file  Social History Narrative   Married, with 2 children   2 yrs of college   Right handed   3-4 cups daily   Social Determinants of Health   Financial Resource Strain: Low Risk  (01/04/2022)   Overall Financial Resource Strain (CARDIA)    Difficulty of Paying Living Expenses: Not hard at all  Food Insecurity: No Food Insecurity (01/04/2022)   Hunger Vital Sign    Worried About Running Out of Food in the Last Year: Never true    Ran Out of Food in the Last Year: Never true   Transportation Needs: No Transportation Needs (01/04/2022)   PRAPARE - Administrator, Civil Service (Medical): No    Lack of Transportation (Non-Medical): No  Physical Activity: Inactive (01/04/2022)   Exercise Vital Sign    Days of Exercise per Week: 0 days    Minutes of Exercise per Session: 0 min  Stress: No Stress Concern Present (01/04/2022)   [employer] of Occupational Health - Occupational Stress Questionnaire    Feeling of Stress : Not at all  Social Connections: Moderately Isolated (01/04/2022)   Social Connection and Isolation Panel [NHANES]    Frequency of Communication with Friends and Family: More than three times a week    Frequency of Social Gatherings with Friends and Family: More than three times a week    Attends Religious Services: Never    Database administrator or Organizations: No    Attends Banker Meetings: Never    Marital Status: Married     Family History: The patient's family history is not on file. ROS:   Please see the history of present illness.    All 14 point review of systems negative except as described per history of present illness.  EKGs/Labs/Other Studies Reviewed:    The following studies were reviewed today:   EKG:       Recent Labs: 12/13/2022: ALT 15; BUN 10; Creatinine, Ser 0.72; Hemoglobin 14.1; Platelets 316.0; Potassium 5.2 No hemolysis seen; Sodium 130; TSH 1.41  Recent Lipid Panel    Component Value Date/Time   CHOL 217 (H) 08/12/2020 1447   TRIG 85.0 08/12/2020 1447   HDL 80.40 08/12/2020 1447   CHOLHDL 3 08/12/2020 1447   VLDL 17.0 08/12/2020 1447   LDLCALC 120 (H) 08/12/2020 1447    Physical Exam:    VS:  BP 134/72 (BP Location: Left Arm, Patient Position: Sitting)   Pulse 68   Ht 5\' 7"  (1.702 m)   Wt 147 lb (66.7 kg)   SpO2 97%   BMI 23.02 kg/m     Wt Readings from Last 3 Encounters:  01/04/23 147 lb (66.7 kg)  12/30/22 146 lb 4 oz (66.3 kg)  12/13/22 145 lb (65.8 kg)     GEN:   Well nourished, well developed in no acute distress HEENT: Normal NECK: No JVD; No carotid bruits LYMPHATICS: No lymphadenopathy CARDIAC: RRR, no murmurs, no rubs, no gallops RESPIRATORY:  Clear to auscultation without rales, wheezing or rhonchi  ABDOMEN: Soft, non-tender, non-distended MUSCULOSKELETAL:  No edema; No deformity  SKIN: Warm and dry NEUROLOGIC:  Alert and oriented x 3 PSYCHIATRIC:  Normal affect   ASSESSMENT:    1. TIA (transient  ischemic attack)   2. Palpitations   3. Dyspnea on exertion   4. History of TIA (transient ischemic attack)   5. Dyslipidemia    PLAN:    In order of problems listed above:  TIA she had TIA in 2015 workup negative since that time now she comes with palpitations obesity concerns about potential atrial fibrillation.  I will ask her to a Zio patch for 2 weeks to see if she got any significant arrhythmia.  As a part evaluation she will be scheduled to have an echocardiogram done to assess heart structurally. Dyspnea exertion echocardiogram to be done to assess left ventricular ejection fraction. Dyslipidemia I do have a fasting lipid profile from 2022 which showed showing HDL 80 LDL 120, will schedule her to have fasting profile repeated.   Medication Adjustments/Labs and Tests Ordered: Current medicines are reviewed at length with the patient today.  Concerns regarding medicines are outlined above.  Orders Placed This Encounter  Procedures   EKG 12-Lead   No orders of the defined types were placed in this encounter.   Signed, Georgeanna Lea, MD, [facility]. 01/04/2023 10:01 AM    [facility]

## 2023-01-05 ENCOUNTER — Ambulatory Visit (HOSPITAL_BASED_OUTPATIENT_CLINIC_OR_DEPARTMENT_OTHER)
Admission: RE | Admit: 2023-01-05 | Discharge: 2023-01-05 | Disposition: A | Discharge status: Home / Self Care | Payer: Medicare Other | Source: Ambulatory Visit | Attending: Cardiology | Admitting: Cardiology

## 2023-01-05 DIAGNOSIS — R0609 Other forms of dyspnea: Secondary | ICD-10-CM | POA: Insufficient documentation

## 2023-01-05 LAB — ECHOCARDIOGRAM COMPLETE
AR max vel: 1.8 cm2
AV Area VTI: 1.92 cm2
AV Area mean vel: 1.89 cm2
AV Mean grad: 4 mmHg
AV Peak grad: 7 mmHg
AV Vena cont: 0.25 cm
Ao pk vel: 1.33 m/s
Area-P 1/2: 2.75 cm2
Calc EF: 59.8 %
MV M vel: 3.5 m/s
MV Peak grad: 48.9 mmHg
P 1/2 time: 506 msec
S' Lateral: 2.7 cm
Single Plane A2C EF: 57.2 %
Single Plane A4C EF: 62.1 %

## 2023-01-21 DIAGNOSIS — R002 Palpitations: Secondary | ICD-10-CM | POA: Diagnosis not present

## 2023-02-15 ENCOUNTER — Ambulatory Visit (INDEPENDENT_AMBULATORY_CARE_PROVIDER_SITE_OTHER): Payer: Medicare Other | Admitting: *Deleted

## 2023-02-15 DIAGNOSIS — Z0000 Encounter for general adult medical examination without abnormal findings: Secondary | ICD-10-CM | POA: Diagnosis not present

## 2023-02-15 NOTE — Patient Instructions (Signed)
Erin Sampson , Thank you for taking time to come for your Medicare Wellness Visit. I appreciate your ongoing commitment to your health goals. Please review the following plan we discussed and let me know if I can assist you in the future.     This is a list of the screening recommended for you and due dates:  Health Maintenance  Topic Date Due   Zoster (Shingles) Vaccine (1 of 2) 03/17/1963   DEXA scan (bone density measurement)  01/29/2023   Flu Shot  08/29/2023*   Medicare Annual Wellness Visit  02/15/2024   DTaP/Tdap/Td vaccine (2 - Td or Tdap) 11/24/2025   Pneumonia Vaccine  Completed   Hepatitis C Screening  Completed   HPV Vaccine  Aged Out   COVID-19 Vaccine  Discontinued  *Topic was postponed. The date shown is not the original due date.    Next appointment: Follow up in one year for your annual wellness visit.   Preventive Care 44 Years and Older, Female Preventive care refers to lifestyle choices and visits with your health care provider that can promote health and wellness. What does preventive care include? A yearly physical exam. This is also called an annual well check. Dental exams once or twice a year. Routine eye exams. Ask your health care provider how often you should have your eyes checked. Personal lifestyle choices, including: Daily care of your teeth and gums. Regular physical activity. Eating a healthy diet. Avoiding tobacco and drug use. Limiting alcohol use. Practicing safe sex. Taking low-dose aspirin every day. Taking vitamin and mineral supplements as recommended by your health care provider. What happens during an annual well check? The services and screenings done by your health care provider during your annual well check will depend on your age, overall health, lifestyle risk factors, and family history of disease. Counseling  Your health care provider may ask you questions about your: Alcohol use. Tobacco use. Drug use. Emotional  well-being. Home and relationship well-being. Sexual activity. Eating habits. History of falls. Memory and ability to understand (cognition). Work and work Astronomer. Reproductive health. Screening  You may have the following tests or measurements: Height, weight, and BMI. Blood pressure. Lipid and cholesterol levels. These may be checked every 5 years, or more frequently if you are over 63 years old. Skin check. Lung cancer screening. You may have this screening every year starting at age 69 if you have a 30-pack-year history of smoking and currently smoke or have quit within the past 15 years. Fecal occult blood test (FOBT) of the stool. You may have this test every year starting at age 7. Flexible sigmoidoscopy or colonoscopy. You may have a sigmoidoscopy every 5 years or a colonoscopy every 10 years starting at age 85. Hepatitis C blood test. Hepatitis B blood test. Sexually transmitted disease (STD) testing. Diabetes screening. This is done by checking your blood sugar (glucose) after you have not eaten for a while (fasting). You may have this done every 1-3 years. Bone density scan. This is done to screen for osteoporosis. You may have this done starting at age 36. Mammogram. This may be done every 1-2 years. Talk to your health care provider about how often you should have regular mammograms. Talk with your health care provider about your test results, treatment options, and if necessary, the need for more tests. Vaccines  Your health care provider may recommend certain vaccines, such as: Influenza vaccine. This is recommended every year. Tetanus, diphtheria, and acellular pertussis (Tdap, Td) vaccine.  You may need a Td booster every 10 years. Zoster vaccine. You may need this after age 34. Pneumococcal 13-valent conjugate (PCV13) vaccine. One dose is recommended after age 55. Pneumococcal polysaccharide (PPSV23) vaccine. One dose is recommended after age 5. Talk to your  health care provider about which screenings and vaccines you need and how often you need them. This information is not intended to replace advice given to you by your health care provider. Make sure you discuss any questions you have with your health care provider. Document Released: 06/13/2015 Document Revised: 02/04/2016 Document Reviewed: 03/18/2015 Elsevier Interactive Patient Education  2017 [employer].  Fall Prevention in the Home Falls can cause injuries. They can happen to people of all ages. There are many things you can do to make your home safe and to help prevent falls. What can I do on the outside of my home? Regularly fix the edges of walkways and driveways and fix any cracks. Remove anything that might make you trip as you walk through a door, such as a raised step or threshold. Trim any bushes or trees on the path to your home. Use bright outdoor lighting. Clear any walking paths of anything that might make someone trip, such as rocks or tools. Regularly check to see if handrails are loose or broken. Make sure that both sides of any steps have handrails. Any raised decks and porches should have guardrails on the edges. Have any leaves, snow, or ice cleared regularly. Use sand or salt on walking paths during winter. Clean up any spills in your garage right away. This includes oil or grease spills. What can I do in the bathroom? Use night lights. Install grab bars by the toilet and in the tub and shower. Do not use towel bars as grab bars. Use non-skid mats or decals in the tub or shower. If you need to sit down in the shower, use a plastic, non-slip stool. Keep the floor dry. Clean up any water that spills on the floor as soon as it happens. Remove soap buildup in the tub or shower regularly. Attach bath mats securely with double-sided non-slip rug tape. Do not have throw rugs and other things on the floor that can make you trip. What can I do in the bedroom? Use night  lights. Make sure that you have a light by your bed that is easy to reach. Do not use any sheets or blankets that are too big for your bed. They should not hang down onto the floor. Have a firm chair that has side arms. You can use this for support while you get dressed. Do not have throw rugs and other things on the floor that can make you trip. What can I do in the kitchen? Clean up any spills right away. Avoid walking on wet floors. Keep items that you use a lot in easy-to-reach places. If you need to reach something above you, use a strong step stool that has a grab bar. Keep electrical cords out of the way. Do not use floor polish or wax that makes floors slippery. If you must use wax, use non-skid floor wax. Do not have throw rugs and other things on the floor that can make you trip. What can I do with my stairs? Do not leave any items on the stairs. Make sure that there are handrails on both sides of the stairs and use them. Fix handrails that are broken or loose. Make sure that handrails are as long as  the stairways. Check any carpeting to make sure that it is firmly attached to the stairs. Fix any carpet that is loose or worn. Avoid having throw rugs at the top or bottom of the stairs. If you do have throw rugs, attach them to the floor with carpet tape. Make sure that you have a light switch at the top of the stairs and the bottom of the stairs. If you do not have them, ask someone to add them for you. What else can I do to help prevent falls? Wear shoes that: Do not have high heels. Have rubber bottoms. Are comfortable and fit you well. Are closed at the toe. Do not wear sandals. If you use a stepladder: Make sure that it is fully opened. Do not climb a closed stepladder. Make sure that both sides of the stepladder are locked into place. Ask someone to hold it for you, if possible. Clearly mark and make sure that you can see: Any grab bars or handrails. First and last  steps. Where the edge of each step is. Use tools that help you move around (mobility aids) if they are needed. These include: Canes. Walkers. Scooters. Crutches. Turn on the lights when you go into a dark area. Replace any light bulbs as soon as they burn out. Set up your furniture so you have a clear path. Avoid moving your furniture around. If any of your floors are uneven, fix them. If there are any pets around you, be aware of where they are. Review your medicines with your doctor. Some medicines can make you feel dizzy. This can increase your chance of falling. Ask your doctor what other things that you can do to help prevent falls. This information is not intended to replace advice given to you by your health care provider. Make sure you discuss any questions you have with your health care provider. Document Released: 03/13/2009 Document Revised: 10/23/2015 Document Reviewed: 06/21/2014 Elsevier Interactive Patient Education  2017 [employer].

## 2023-02-15 NOTE — Progress Notes (Signed)
Subjective:   Erin Sampson is a 77 y.o. female who presents for Medicare Annual (Subsequent) preventive examination.  Visit Complete: Virtual  I connected with  Erin Sampson on 02/15/23 by a audio enabled telemedicine application and verified that I am speaking with the correct person using two identifiers.  Patient Location: Home  Provider Location: Office/Clinic  I discussed the limitations of evaluation and management by telemedicine. The patient expressed understanding and agreed to proceed.   Cardiac Risk Factors include: advanced age (>16men, >47 women)     Objective:    Vital Signs: Unable to obtain new vitals due to this being a telehealth visit.      02/15/2023    3:09 PM 01/04/2022   12:30 PM 08/28/2021    9:00 PM 01/26/2020   11:16 AM  Advanced Directives  Does Patient Have a Medical Advance Directive? Yes Yes No No  Type of Estate agent of [place];Living will Healthcare Power of [place];Living will    Does patient want to make changes to medical advance directive? No - Patient declined     Copy of Healthcare Power of Attorney in Chart? No - copy requested No - copy requested    Would patient like information on creating a medical advance directive?   No - Patient declined     Current Medications (verified) Outpatient Encounter Medications as of 02/15/2023  Medication Sig   aspirin 81 MG tablet Take 325 mg by mouth daily.   B Complex Vitamins (B COMPLEX 1 PO) Take 1 tablet by mouth daily.   calcium carbonate (OS-CAL) 600 MG TABS tablet Take 600 mg by mouth 2 (two) times daily with a meal.   cholecalciferol (VITAMIN D) 1000 UNITS tablet Take 3,000 Units by mouth daily.   magnesium gluconate (MAGONATE) 500 MG tablet Take 500 mg by mouth 2 (two) times daily.   Turmeric 1053 MG TABS Take 1 tablet by mouth daily.   [DISCONTINUED] amoxicillin-clavulanate (AUGMENTIN) 875-125 MG tablet Take 1 tablet by mouth 2 (two) times daily. (Patient  taking differently: Take 1 tablet by mouth 2 (two) times daily. Started 12/30/2022)   No facility-administered encounter medications on file as of 02/15/2023.    Allergies (verified) Codeine and Other   History: Past Medical History:  Diagnosis Date   Allergy    Bursitis    Depression    Diverticulitis    Dyslipidemia 01/04/2023   IBS (irritable bowel syndrome)    Thyroid nodule 12/31/2022   Past Surgical History:  Procedure Laterality Date   ABDOMINAL HYSTERECTOMY     for bleeding/polyps   APPENDECTOMY     History reviewed. No pertinent family history. Social History   Socioeconomic History   Marital status: Married    Spouse name: Not on file   Number of children: Not on file   Years of education: Not on file   Highest education level: Not on file  Occupational History   Occupation: retired  Tobacco Use   Smoking status: Former    Current packs/day: 0.00    Average packs/day: 1 pack/day for 20.0 years (20.0 ttl pk-yrs)    Types: Cigarettes    Start date: 03/16/1963    Quit date: 03/16/1983    Years since quitting: 39.9   Smokeless tobacco: Never   Tobacco comments:    Quit at age 20   Vaping Use   Vaping status: Never Used  Substance and Sexual Activity   Alcohol use: No   Drug use: No  Sexual activity: Not Currently  Other Topics Concern   Not on file  Social History Narrative   Married, with 2 children   2 yrs of college   Right handed   3-4 cups daily   Social Determinants of Health   Financial Resource Strain: Low Risk  (02/15/2023)   Overall Financial Resource Strain (CARDIA)    Difficulty of Paying Living Expenses: Not hard at all  Food Insecurity: No Food Insecurity (02/15/2023)   Hunger Vital Sign    Worried About Running Out of Food in the Last Year: Never true    Ran Out of Food in the Last Year: Never true  Transportation Needs: No Transportation Needs (02/15/2023)   PRAPARE - Administrator, Civil Service (Medical): No    Lack  of Transportation (Non-Medical): No  Physical Activity: Inactive (02/15/2023)   Exercise Vital Sign    Days of Exercise per Week: 0 days    Minutes of Exercise per Session: 0 min  Stress: No Stress Concern Present (02/15/2023)   [employer] of Occupational Health - Occupational Stress Questionnaire    Feeling of Stress : Not at all  Social Connections: Socially Integrated (02/15/2023)   Social Connection and Isolation Panel [NHANES]    Frequency of Communication with Friends and Family: More than three times a week    Frequency of Social Gatherings with Friends and Family: More than three times a week    Attends Religious Services: More than 4 times per year    Active Member of [employer] or Organizations: Yes    Attends Engineer, structural: More than 4 times per year    Marital Status: Married    Tobacco Counseling Counseling given: Not Answered Tobacco comments: Quit at age 43    Clinical Intake:  Pre-visit preparation completed: Yes  Pain : No/denies pain  Diabetes: No  How often do you need to have someone help you when you read instructions, pamphlets, or other written materials from your doctor or pharmacy?: 1 - Never  Interpreter Needed?: No  Information entered by :: [employer], CMA   Activities of Daily Living    02/15/2023    3:13 PM  In your present state of health, do you have any difficulty performing the following activities:  Hearing? 1  Vision? 1  Difficulty concentrating or making decisions? 1  Walking or climbing stairs? 0  Dressing or bathing? 0  Doing errands, shopping? 0  Preparing Food and eating ? N  Using the Toilet? N  In the past six months, have you accidently leaked urine? N  Do you have problems with loss of bowel control? N  Managing your Medications? N  Managing your Finances? N  Housekeeping or managing your Housekeeping? N    Patient Care Team: [name], Kateri Mc as PCP - General (Internal Medicine)  Indicate  any recent Medical Services you may have received from other than [facility] providers in the past year (date may be approximate).     Assessment:   This is a routine wellness examination for Erin Sampson.  Hearing/Vision screen No results found.   Goals Addressed   None    Depression Screen    02/15/2023    3:17 PM 01/04/2022   12:33 PM 05/26/2021   10:39 AM 08/12/2020    1:48 PM  PHQ 2/9 Scores  PHQ - 2 Score 0 0 0 0    Fall Risk    02/15/2023    3:13 PM 01/04/2022  12:31 PM 05/26/2021   10:38 AM  Fall Risk   Falls in the past year? 0 0 0  Number falls in past yr: 0 0 0  Injury with Fall? 0 0 0  Risk for fall due to : No Fall Risks  No Fall Risks  Follow up Falls evaluation completed Falls prevention discussed Falls evaluation completed    MEDICARE RISK AT HOME: Medicare Risk at Home Any stairs in or around the home?: No If so, are there any without handrails?: No Home free of loose throw rugs in walkways, pet beds, electrical cords, etc?: Yes Adequate lighting in your home to reduce risk of falls?: Yes Life alert?: No Use of a cane, walker or w/c?: No Grab bars in the bathroom?: No Shower chair or bench in shower?: Yes Elevated toilet seat or a handicapped toilet?: No  TIMED UP AND GO:  Was the test performed?  No    Cognitive Function:        02/15/2023    3:21 PM  6CIT Screen  What Year? 0 points  What month? 0 points  What time? 0 points  Count back from 20 0 points  Months in reverse 0 points  Repeat phrase 0 points  Total Score 0 points    Immunizations Immunization History  Administered Date(s) Administered   Fluad Quad(high Dose 65+) 04/06/2021, 06/03/2022   PFIZER(Purple Top)SARS-COV-2 Vaccination 06/16/2019, 07/04/2019, 04/18/2020   Pneumococcal Conjugate-13 07/02/2015   Pneumococcal Polysaccharide-23 12/21/2016   Tdap 11/25/2015   Zoster, Live 07/02/2015    TDAP status: Up to date  Flu Vaccine status: Due, Education has been provided  regarding the importance of this vaccine. Advised may receive this vaccine at local pharmacy or Health Dept. Aware to provide a copy of the vaccination record if obtained from local pharmacy or Health Dept. Verbalized acceptance and understanding.  Pneumococcal vaccine status: Up to date  Covid-19 vaccine status: Information provided on how to obtain vaccines.   Qualifies for Shingles Vaccine? Yes   Zostavax completed Yes   Shingrix Completed?: No.    Education has been provided regarding the importance of this vaccine. Patient has been advised to call insurance company to determine out of pocket expense if they have not yet received this vaccine. Advised may also receive vaccine at local pharmacy or Health Dept. Verbalized acceptance and understanding.  Screening Tests Health Maintenance  Topic Date Due   Zoster Vaccines- Shingrix (1 of 2) 03/17/1963   Medicare Annual Wellness (AWV)  01/05/2023   DEXA SCAN  01/29/2023   INFLUENZA VACCINE  08/29/2023 (Originally 12/30/2022)   DTaP/Tdap/Td (2 - Td or Tdap) 11/24/2025   Pneumonia Vaccine 28+ Years old  Completed   Hepatitis C Screening  Completed   HPV VACCINES  Aged Out   COVID-19 Vaccine  Discontinued    Health Maintenance  Health Maintenance Due  Topic Date Due   Zoster Vaccines- Shingrix (1 of 2) 03/17/1963   Medicare Annual Wellness (AWV)  01/05/2023   DEXA SCAN  01/29/2023    Colorectal cancer screening: No longer required.   Mammogram status: Completed 02/22/22. Repeat every year Ordered by OBGYN  Bone Density status: Completed 02/12/21. Results reflect: Bone density results: OSTEOPOROSIS. Repeat every 2 years. Ordered by OBGYN  Lung Cancer Screening: (Low Dose CT Chest recommended if Age 39-80 years, 20 pack-year currently smoking OR have quit w/in 15years.) does not qualify.   Additional Screening:  Hepatitis C Screening: does qualify; Completed 08/12/20  Vision Screening: Recommended annual ophthalmology  exams for  early detection of glaucoma and other disorders of the eye. Is the patient up to date with their annual eye exam?  Yes  Who is the provider or what is the name of the office in which the patient attends annual eye exams? Dr.Groat If pt is not established with a provider, would they like to be referred to a provider to establish care? No .   Dental Screening: Recommended annual dental exams for proper oral hygiene  Diabetic Foot Exam: N/a  Community Resource Referral / Chronic Care Management: CRR required this visit?  No   CCM required this visit?  No     Plan:     I have personally reviewed and noted the following in the patient's chart:   Medical and social history Use of alcohol, tobacco or illicit drugs  Current medications and supplements including opioid prescriptions. Patient is not currently taking opioid prescriptions. Functional ability and status Nutritional status Physical activity Advanced directives List of other physicians Hospitalizations, surgeries, and ER visits in previous 12 months Vitals Screenings to include cognitive, depression, and falls Referrals and appointments  In addition, I have reviewed and discussed with patient certain preventive protocols, quality metrics, and best practice recommendations. A written personalized care plan for preventive services as well as general preventive health recommendations were provided to patient.     Donne Anon, CMA   02/15/2023   After Visit Summary: (MyChart) Due to this being a telephonic visit, the after visit summary with patients personalized plan was offered to patient via MyChart   Nurse Notes: None

## 2023-02-16 ENCOUNTER — Other Ambulatory Visit (HOSPITAL_BASED_OUTPATIENT_CLINIC_OR_DEPARTMENT_OTHER): Payer: Self-pay | Admitting: Medical

## 2023-02-16 DIAGNOSIS — Z1231 Encounter for screening mammogram for malignant neoplasm of breast: Secondary | ICD-10-CM

## 2023-03-10 DIAGNOSIS — H43823 Vitreomacular adhesion, bilateral: Secondary | ICD-10-CM | POA: Diagnosis not present

## 2023-03-10 DIAGNOSIS — H31103 Choroidal degeneration, unspecified, bilateral: Secondary | ICD-10-CM | POA: Diagnosis not present

## 2023-03-10 DIAGNOSIS — H2513 Age-related nuclear cataract, bilateral: Secondary | ICD-10-CM | POA: Diagnosis not present

## 2023-03-10 DIAGNOSIS — H1045 Other chronic allergic conjunctivitis: Secondary | ICD-10-CM | POA: Diagnosis not present

## 2023-03-10 DIAGNOSIS — H04123 Dry eye syndrome of bilateral lacrimal glands: Secondary | ICD-10-CM | POA: Diagnosis not present

## 2023-03-10 DIAGNOSIS — H43811 Vitreous degeneration, right eye: Secondary | ICD-10-CM | POA: Diagnosis not present

## 2023-03-15 ENCOUNTER — Ambulatory Visit (HOSPITAL_BASED_OUTPATIENT_CLINIC_OR_DEPARTMENT_OTHER)
Admission: RE | Admit: 2023-03-15 | Discharge: 2023-03-15 | Disposition: A | Discharge status: Home / Self Care | Payer: Medicare Other | Source: Ambulatory Visit | Attending: Medical | Admitting: Medical

## 2023-03-15 ENCOUNTER — Encounter (HOSPITAL_BASED_OUTPATIENT_CLINIC_OR_DEPARTMENT_OTHER): Payer: Self-pay

## 2023-03-15 DIAGNOSIS — Z1231 Encounter for screening mammogram for malignant neoplasm of breast: Secondary | ICD-10-CM | POA: Diagnosis not present

## 2023-04-07 ENCOUNTER — Ambulatory Visit: Payer: Medicare Other | Admitting: Family Medicine

## 2023-04-07 ENCOUNTER — Encounter: Payer: Self-pay | Admitting: Family Medicine

## 2023-04-07 VITALS — BP 126/70 | HR 70 | Ht 64.0 in | Wt 151.0 lb

## 2023-04-07 DIAGNOSIS — M810 Age-related osteoporosis without current pathological fracture: Secondary | ICD-10-CM

## 2023-04-07 DIAGNOSIS — Z01419 Encounter for gynecological examination (general) (routine) without abnormal findings: Secondary | ICD-10-CM

## 2023-04-07 NOTE — Progress Notes (Signed)
   ANNUAL EXAM Patient name: Erin Sampson MRN [id number]  Date of birth: 1945/10/05 Chief Complaint:   Annual Exam  History of Present Illness:   Erin Sampson is a 77 y.o.  No obstetric history on file.  female  being seen today for a routine annual exam.  Current complaints: none.   No LMP recorded. Patient has had a hysterectomy.    Last pap n/a. H/O abnormal pap: no Last mammogram: 03/2023. Results were: normal. Family h/o breast cancer: no Last colonoscopy:      02/15/2023    3:17 PM 01/04/2022   12:33 PM 05/26/2021   10:39 AM 08/12/2020    1:48 PM  Depression screen PHQ 2/9  Decreased Interest 0 0 0 0  Down, Depressed, Hopeless 0 0 0 0  PHQ - 2 Score 0 0 0 0         No data to display           Review of Systems:   Pertinent items are noted in HPI Denies any headaches, blurred vision, fatigue, shortness of breath, chest pain, abdominal pain, abnormal vaginal discharge/itching/odor/irritation, problems with periods, bowel movements, urination, or intercourse unless otherwise stated above. Pertinent History Reviewed:  Reviewed past medical,surgical, social and family history.  Reviewed problem list, medications and allergies. Physical Assessment:   Vitals:   04/07/23 1127  BP: 126/70  Pulse: 70  Weight: 151 lb (68.5 kg)  Height: 5\' 4"  (1.626 m)  Body mass index is 25.92 kg/m.        Physical Examination:   General appearance - well appearing, and in no distress  Mental status - alert, oriented to person, place, and time  Psych:  She has a normal mood and affect  Skin - warm and dry, normal color, no suspicious lesions noted  Chest - effort normal, all lung fields clear to auscultation bilaterally  Heart - normal rate and regular rhythm  Neck:  midline trachea, no thyromegaly or nodules  Breasts - breasts appear normal, no suspicious masses, no skin or nipple changes or axillary nodes  Abdomen - soft, nontender, nondistended, no masses or  organomegaly  Pelvic - VULVA: normal appearing vulva with no masses, tenderness or lesions  VAGINA: normal appearing vagina with normal color and discharge, no lesions  CERVIX: normal appearing cervix without discharge or lesions, no CMT   UTERUS: surgically absent.    ADNEXA: absent  Extremities:  No swelling or varicosities noted  Chaperone present for exam  Assessment & Plan:  1. Well woman exam with routine gynecological exam  2. Age-related osteoporosis without current pathological fracture Taking calcium/Vit D.  Last bone denisty 2 years ago. Will repeat. - DG Bone Density; Future   Labs/procedures today:   Orders Placed This Encounter  Procedures   DG Bone Density    Meds: No orders of the defined types were placed in this encounter.   Follow-up: No follow-ups on file.  Levie Heritage, DO 04/07/2023 12:55 PM

## 2023-04-12 ENCOUNTER — Ambulatory Visit: Payer: Medicare Other | Attending: Cardiology | Admitting: Cardiology

## 2023-04-12 ENCOUNTER — Encounter: Payer: Self-pay | Admitting: Cardiology

## 2023-04-12 VITALS — BP 138/72 | HR 74 | Ht 64.5 in | Wt 152.0 lb

## 2023-04-12 DIAGNOSIS — E785 Hyperlipidemia, unspecified: Secondary | ICD-10-CM | POA: Diagnosis not present

## 2023-04-12 DIAGNOSIS — Z8673 Personal history of transient ischemic attack (TIA), and cerebral infarction without residual deficits: Secondary | ICD-10-CM | POA: Diagnosis not present

## 2023-04-12 DIAGNOSIS — R002 Palpitations: Secondary | ICD-10-CM | POA: Diagnosis not present

## 2023-04-12 NOTE — Progress Notes (Signed)
Cardiology Office Note:    Date:  04/12/2023   ID:  Erin Sampson, DOB 08/13/1945, MRN [id number]  PCP:  [name], PA-C  Cardiologist:  Gypsy Balsam, MD    Referring MD: [name], [place]   Chief Complaint  Patient presents with   Follow-up    History of Present Illness:    Erin Sampson is a 77 y.o. female past medical history significant for TIA that she suffered from in 2015, extensive workup at the time has been negative.  She was referred to [place] because of palpitations she did for monitor which shows some nonsustained supraventricular tachycardia asymptomatic, echocardiograms been performed which showed provide preserved left ventricle ejection fraction.  She comes today to months for follow-up overall she is doing very well.  She denies have any chest pain tightness squeezing pressure burning chest no palpitations.  She quit drinking coffee and tea and that look like to care of the problem.  Past Medical History:  Diagnosis Date   Allergy    Bursitis    Depression    Diverticulitis    Dyslipidemia 01/04/2023   IBS (irritable bowel syndrome)    Thyroid nodule 12/31/2022    Past Surgical History:  Procedure Laterality Date   ABDOMINAL HYSTERECTOMY     for bleeding/polyps   APPENDECTOMY      Current Medications: Current Meds  Medication Sig   aspirin 81 MG tablet Take 325 mg by mouth daily.   B Complex Vitamins (B COMPLEX 1 PO) Take 1 tablet by mouth daily.   calcium carbonate (OS-CAL) 600 MG TABS tablet Take 600 mg by mouth 2 (two) times daily with a meal.   cholecalciferol (VITAMIN D) 1000 UNITS tablet Take 5,000 Units by mouth daily.   magnesium gluconate (MAGONATE) 500 MG tablet Take 500 mg by mouth 2 (two) times daily.   Turmeric 1053 MG TABS Take 1 tablet by mouth daily.     Allergies:   Codeine, Tea tree oil, and Other   Social History   Socioeconomic History   Marital status: Married    Spouse name: Not on file   Number of children:  Not on file   Years of education: Not on file   Highest education level: Not on file  Occupational History   Occupation: retired  Tobacco Use   Smoking status: Former    Current packs/day: 0.00    Average packs/day: 1 pack/day for 20.0 years (20.0 ttl pk-yrs)    Types: Cigarettes    Start date: 03/16/1963    Quit date: 03/16/1983    Years since quitting: 40.1   Smokeless tobacco: Never   Tobacco comments:    Quit at age 29   Vaping Use   Vaping status: Never Used  Substance and Sexual Activity   Alcohol use: No   Drug use: No   Sexual activity: Not Currently  Other Topics Concern   Not on file  Social History Narrative   Married, with 2 children   2 yrs of college   Right handed   3-4 cups daily   Social Determinants of Health   Financial Resource Strain: Low Risk  (02/15/2023)   Overall Financial Resource Strain (CARDIA)    Difficulty of Paying Living Expenses: Not hard at all  Food Insecurity: No Food Insecurity (02/15/2023)   Hunger Vital Sign    Worried About Running Out of Food in the Last Year: Never true    Ran Out of Food in the Last Year: Never  true  Transportation Needs: No Transportation Needs (02/15/2023)   PRAPARE - Administrator, Civil Service (Medical): No    Lack of Transportation (Non-Medical): No  Physical Activity: Inactive (02/15/2023)   Exercise Vital Sign    Days of Exercise per Week: 0 days    Minutes of Exercise per Session: 0 min  Stress: No Stress Concern Present (02/15/2023)   [employer] of Occupational Health - Occupational Stress Questionnaire    Feeling of Stress : Not at all  Social Connections: Socially Integrated (02/15/2023)   Social Connection and Isolation Panel [NHANES]    Frequency of Communication with Friends and Family: More than three times a week    Frequency of Social Gatherings with Friends and Family: More than three times a week    Attends Religious Services: More than 4 times per year    Active Member  of [employer] or Organizations: Yes    Attends Engineer, structural: More than 4 times per year    Marital Status: Married     Family History: The patient's family history is not on file. ROS:   Please see the history of present illness.    All 14 point review of systems negative except as described per history of present illness  EKGs/Labs/Other Studies Reviewed:         Recent Labs: 12/13/2022: ALT 15; BUN 10; Creatinine, Ser 0.72; Hemoglobin 14.1; Platelets 316.0; Potassium 5.2 No hemolysis seen; Sodium 130; TSH 1.41  Recent Lipid Panel    Component Value Date/Time   CHOL 229 (H) 01/04/2023 1030   TRIG 47 01/04/2023 1030   HDL 92 01/04/2023 1030   CHOLHDL 2.5 01/04/2023 1030   CHOLHDL 3 08/12/2020 1447   VLDL 17.0 08/12/2020 1447   LDLCALC 129 (H) 01/04/2023 1030    Physical Exam:    VS:  BP 138/72 (BP Location: Left Arm, Patient Position: Sitting)   Pulse 74   Ht 5' 4.5" (1.638 m)   Wt 152 lb (68.9 kg)   SpO2 94%   BMI 25.69 kg/m     Wt Readings from Last 3 Encounters:  04/12/23 152 lb (68.9 kg)  04/07/23 151 lb (68.5 kg)  01/04/23 147 lb (66.7 kg)     GEN:  Well nourished, well developed in no acute distress HEENT: Normal NECK: No JVD; No carotid bruits LYMPHATICS: No lymphadenopathy CARDIAC: RRR, no murmurs, no rubs, no gallops RESPIRATORY:  Clear to auscultation without rales, wheezing or rhonchi  ABDOMEN: Soft, non-tender, non-distended MUSCULOSKELETAL:  No edema; No deformity  SKIN: Warm and dry LOWER EXTREMITIES: no swelling NEUROLOGIC:  Alert and oriented x 3 PSYCHIATRIC:  Normal affect   ASSESSMENT:    1. Dyslipidemia   2. Palpitations   3. History of TIA (transient ischemic attack)    PLAN:    In order of problems listed above:  Palpitations denies having any right now.  Doing very well.  She discontinued caffeinated drink that seems to make a difference. Dyslipidemia I did review K PN which show me her LDL of 92 LDL 129  asymptomatic we will continue present management. History of TIA on aspirin which we will continue   Medication Adjustments/Labs and Tests Ordered: Current medicines are reviewed at length with the patient today.  Concerns regarding medicines are outlined above.  No orders of the defined types were placed in this encounter.  Medication changes: No orders of the defined types were placed in this encounter.   Signed, Georgeanna Lea, MD,  [facility] 04/12/2023 3:55 PM    [facility]

## 2023-04-12 NOTE — Patient Instructions (Signed)

## 2023-04-14 ENCOUNTER — Ambulatory Visit (HOSPITAL_BASED_OUTPATIENT_CLINIC_OR_DEPARTMENT_OTHER)
Admission: RE | Admit: 2023-04-14 | Discharge: 2023-04-14 | Disposition: A | Discharge status: Home / Self Care | Payer: Medicare Other | Source: Ambulatory Visit | Attending: Family Medicine | Admitting: Family Medicine

## 2023-04-14 DIAGNOSIS — Z780 Asymptomatic menopausal state: Secondary | ICD-10-CM | POA: Diagnosis not present

## 2023-04-14 DIAGNOSIS — M810 Age-related osteoporosis without current pathological fracture: Secondary | ICD-10-CM | POA: Diagnosis not present

## 2023-04-27 ENCOUNTER — Encounter: Payer: Self-pay | Admitting: Family Medicine

## 2023-05-12 ENCOUNTER — Ambulatory Visit: Payer: Medicare Other | Admitting: Family Medicine

## 2023-06-22 ENCOUNTER — Ambulatory Visit (INDEPENDENT_AMBULATORY_CARE_PROVIDER_SITE_OTHER): Payer: Medicare Other | Admitting: Medical

## 2023-06-22 VITALS — BP 120/70 | HR 83 | Temp 97.7°F | Resp 18 | Ht 64.0 in | Wt 148.6 lb

## 2023-06-22 DIAGNOSIS — J320 Chronic maxillary sinusitis: Secondary | ICD-10-CM

## 2023-06-22 DIAGNOSIS — E875 Hyperkalemia: Secondary | ICD-10-CM

## 2023-06-22 MED ORDER — BENZONATATE 100 MG PO CAPS: 100.0000 mg | ORAL_CAPSULE | Freq: Three times a day (TID) | ORAL | 0 refills | Status: DC | PRN

## 2023-06-22 MED ORDER — FLUTICASONE PROPIONATE 50 MCG/ACT NA SUSP
2.0000 | Freq: Every day | NASAL | 1 refills | Status: DC
Start: 2023-06-22 — End: 2023-11-18

## 2023-06-22 MED ORDER — AMOXICILLIN-POT CLAVULANATE 875-125 MG PO TABS
1.0000 | ORAL_TABLET | Freq: Two times a day (BID) | ORAL | 0 refills | Status: DC
Start: 2023-06-22 — End: 2023-10-21

## 2023-06-22 NOTE — Progress Notes (Signed)
Subjective:    Patient ID: Erin Sampson, female    DOB: 1946-04-27, 78 y.o.   MRN: [id number]  HPI   Discussed the use of AI scribe software for clinical note transcription with the patient, who gave verbal consent to proceed.  History of Present Illness   The patient presents with a chief complaint of sinus discomfort, which has been ongoing for approximately four to five days. They report a history of annual sinus infections. The discomfort began with postnasal drainage and has progressed to include facial soreness, particularly in the maxillary sinus region, and toothache in the upper right quadrant. The patient also reports pressure behind the left eye and in the left ear, which they describe as feeling "stocked up," leading to perceived hearing impairment.  The patient denies any preceding cold-like symptoms or coughing (only at night rare), but notes that the postnasal drainage has increased in severity. The drainage has transitioned from clear to colored mucus, described as yellowish-green, and the patient noticed a tinge of blood in the mucus this morning.  Despite these symptoms, the patient denies experiencing any fever, chills, sweats, or body aches. The patient has not yet taken any medication for these symptoms. Last year, the patient was treated with Augmentin and Flonase for a similar episode, which they report was effective.         Review of Systems  Constitutional:  Negative for chills and fever.  HENT:  Positive for congestion and ear pain.        Pnd  Respiratory:  Positive for cough. Negative for shortness of breath and wheezing.   Cardiovascular:  Negative for chest pain and palpitations.  Musculoskeletal:  Negative for back pain and joint swelling.  Skin:  Negative for rash.  Neurological:  Negative for dizziness and headaches.  Hematological:  Negative for adenopathy. Does not bruise/bleed easily.    Past Medical History:  Diagnosis Date   Allergy     Bursitis    Depression    Diverticulitis    Dyslipidemia 01/04/2023   IBS (irritable bowel syndrome)    Thyroid nodule 12/31/2022     Social History   Socioeconomic History   Marital status: Married    Spouse name: Not on file   Number of children: Not on file   Years of education: Not on file   Highest education level: Not on file  Occupational History   Occupation: retired  Tobacco Use   Smoking status: Former    Current packs/day: 0.00    Average packs/day: 1 pack/day for 20.0 years (20.0 ttl pk-yrs)    Types: Cigarettes    Start date: 03/16/1963    Quit date: 03/16/1983    Years since quitting: 40.2   Smokeless tobacco: Never   Tobacco comments:    Quit at age 1   Vaping Use   Vaping status: Never Used  Substance and Sexual Activity   Alcohol use: No   Drug use: No   Sexual activity: Not Currently  Other Topics Concern   Not on file  Social History Narrative   Married, with 2 children   2 yrs of college   Right handed   3-4 cups daily   Social Drivers of Health   Financial Resource Strain: Low Risk  (02/15/2023)   Overall Financial Resource Strain (CARDIA)    Difficulty of Paying Living Expenses: Not hard at all  Food Insecurity: No Food Insecurity (02/15/2023)   Hunger Vital Sign    Worried About Running  Out of Food in the Last Year: Never true    Ran Out of Food in the Last Year: Never true  Transportation Needs: No Transportation Needs (02/15/2023)   PRAPARE - Administrator, Civil Service (Medical): No    Lack of Transportation (Non-Medical): No  Physical Activity: Inactive (02/15/2023)   Exercise Vital Sign    Days of Exercise per Week: 0 days    Minutes of Exercise per Session: 0 min  Stress: No Stress Concern Present (02/15/2023)   [employer] of Occupational Health - Occupational Stress Questionnaire    Feeling of Stress : Not at all  Social Connections: Socially Integrated (02/15/2023)   Social Connection and Isolation Panel  [NHANES]    Frequency of Communication with Friends and Family: More than three times a week    Frequency of Social Gatherings with Friends and Family: More than three times a week    Attends Religious Services: More than 4 times per year    Active Member of [employer] or Organizations: Yes    Attends Banker Meetings: More than 4 times per year    Marital Status: Married  Catering manager Violence: Not At Risk (02/15/2023)   Humiliation, Afraid, Rape, and Kick questionnaire    Fear of Current or Ex-Partner: No    Emotionally Abused: No    Physically Abused: No    Sexually Abused: No    Past Surgical History:  Procedure Laterality Date   ABDOMINAL HYSTERECTOMY     for bleeding/polyps   APPENDECTOMY      No family history on file.  Allergies  Allergen Reactions   Codeine Itching   Tea Tree Oil Other (See Comments)    Dizzy    Other Palpitations    Clorox    Current Outpatient Medications on File Prior to Visit  Medication Sig Dispense Refill   aspirin 81 MG tablet Take 325 mg by mouth daily.     B Complex Vitamins (B COMPLEX 1 PO) Take 1 tablet by mouth daily.     calcium carbonate (OS-CAL) 600 MG TABS tablet Take 600 mg by mouth 2 (two) times daily with a meal.     cholecalciferol (VITAMIN D) 1000 UNITS tablet Take 5,000 Units by mouth daily.     magnesium gluconate (MAGONATE) 500 MG tablet Take 500 mg by mouth 2 (two) times daily.     Turmeric 1053 MG TABS Take 1 tablet by mouth daily.     No current facility-administered medications on file prior to visit.    BP 120/70   Pulse 83   Temp 97.7 F (36.5 C)   Resp 18   Ht 5\' 4"  (1.626 m)   Wt 148 lb 9.6 oz (67.4 kg)   SpO2 99%   BMI 25.51 kg/m        Objective:   Physical Exam  General- No acute distress. Pleasant patient. Neck- Full range of motion, no jvd Lungs- Clear, even and unlabored. Heart- regular rate and rhythm. Neurologic- CNII- XII grossly intact.   Heent- bilaterall maxillary sinus  pressure. Normal pharynx. Ears- canals clear and normal tms.      Assessment & Plan:  Assessment and Plan    Acute Sinusitis Symptoms of sinus pressure, postnasal drainage, and colored mucus for approximately 5 days. Maxillary sinus tenderness on examination. History of annual sinus infections. -Prescribe Augmentin twice daily for 10 days. -Prescribe Flonase for nasal congestion. -Provide Benzonatate for potential cough due to postnasal drainage. -Advise patient to  report if symptoms do not improve or if not back to normal by the end of the antibiotic course.   Follow up 10-14 days or sooner if needed        [employer], PA-C

## 2023-06-22 NOTE — Patient Instructions (Signed)
Acute Sinusitis Symptoms of sinus pressure, postnasal drainage, and colored mucus for approximately 5 days. Maxillary sinus tenderness on examination. History of annual sinus infections. -Prescribe Augmentin twice daily for 10 days. -Prescribe Flonase for nasal congestion. -Provide Benzonatate for potential cough due to postnasal drainage. -Advise patient to report if symptoms do not improve or if not back to normal by the end of the antibiotic course.   Follow up 10-14 days or sooner if needed

## 2023-06-23 ENCOUNTER — Encounter: Payer: Self-pay | Admitting: Medical

## 2023-06-23 LAB — COMPREHENSIVE METABOLIC PANEL
ALT: 13 U/L (ref 0–35)
AST: 14 U/L (ref 0–37)
Albumin: 4.3 g/dL (ref 3.5–5.2)
Alkaline Phosphatase: 68 U/L (ref 39–117)
BUN: 16 mg/dL (ref 6–23)
CO2: 30 meq/L (ref 19–32)
Calcium: 9.6 mg/dL (ref 8.4–10.5)
Chloride: 100 meq/L (ref 96–112)
Creatinine, Ser: 0.78 mg/dL (ref 0.40–1.20)
GFR: 72.32 mL/min (ref >60.00)
Glucose, Bld: 94 mg/dL (ref 70–99)
Potassium: 5.5 meq/L — ABNORMAL HIGH (ref 3.5–5.1)
Sodium: 139 meq/L (ref 135–145)
Total Bilirubin: 0.4 mg/dL (ref 0.2–1.2)
Total Protein: 6.9 g/dL (ref 6.0–8.3)

## 2023-06-23 NOTE — Addendum Note (Signed)
Addended by: Gwenevere Abbot on: 06/23/2023 09:15 PM   Modules accepted: Orders

## 2023-06-29 ENCOUNTER — Other Ambulatory Visit (INDEPENDENT_AMBULATORY_CARE_PROVIDER_SITE_OTHER): Payer: Medicare Other

## 2023-06-29 ENCOUNTER — Encounter: Payer: Self-pay | Admitting: Medical

## 2023-06-29 DIAGNOSIS — E875 Hyperkalemia: Secondary | ICD-10-CM

## 2023-06-29 LAB — COMPREHENSIVE METABOLIC PANEL
ALT: 13 U/L (ref 0–35)
AST: 15 U/L (ref 0–37)
Albumin: 4.2 g/dL (ref 3.5–5.2)
Alkaline Phosphatase: 66 U/L (ref 39–117)
BUN: 13 mg/dL (ref 6–23)
CO2: 32 meq/L (ref 19–32)
Calcium: 9.4 mg/dL (ref 8.4–10.5)
Chloride: 100 meq/L (ref 96–112)
Creatinine, Ser: 0.73 mg/dL (ref 0.40–1.20)
GFR: 78.29 mL/min (ref >60.00)
Glucose, Bld: 98 mg/dL (ref 70–99)
Potassium: 4.9 meq/L (ref 3.5–5.1)
Sodium: 137 meq/L (ref 135–145)
Total Bilirubin: 0.4 mg/dL (ref 0.2–1.2)
Total Protein: 6.4 g/dL (ref 6.0–8.3)

## 2023-07-01 ENCOUNTER — Encounter: Payer: Self-pay | Admitting: Medical

## 2023-07-01 ENCOUNTER — Other Ambulatory Visit: Payer: Medicare Other

## 2023-07-05 ENCOUNTER — Ambulatory Visit (INDEPENDENT_AMBULATORY_CARE_PROVIDER_SITE_OTHER): Payer: Medicare Other | Admitting: Medical

## 2023-07-05 VITALS — BP 135/66 | HR 67 | Temp 97.7°F | Resp 16 | Ht 64.0 in | Wt 148.0 lb

## 2023-07-05 DIAGNOSIS — E875 Hyperkalemia: Secondary | ICD-10-CM

## 2023-07-05 NOTE — Progress Notes (Deleted)
   Subjective:    Patient ID: Erin Sampson, female    DOB: 1945-06-14, 78 y.o.   MRN: [id number]  HPI    Review of Systems     Objective:   Physical Exam        Assessment & Plan:

## 2023-07-05 NOTE — Patient Instructions (Signed)
 Hyperkalemia Recent labs showed elevated potassium levels (5.1, 5.5, 4.9). No known kidney disorders or medications that could contribute to hyperkalemia. Possible dietary cause, including increased banana consumption. -Repeat metabolic panel in one week to monitor potassium levels. -Advise patient to maintain normal diet, but avoid excessive potassium-rich foods. -Ensure patient is well-hydrated prior to blood draw to avoid difficult draw.   Follow up date to be determined after lab review.

## 2023-07-05 NOTE — Progress Notes (Signed)
   Subjective:    Patient ID: Erin Sampson, female    DOB: [birth year], 78 y.o.   MRN: [id number]  Discussed the use of AI scribe software for clinical note transcription with the patient, who gave verbal consent to proceed.  History of Present Illness   Erin Sampson is a 78 year old female who presents with concerns about elevated potassium levels.  She has experienced a recent increase in her potassium levels, which were previously normal. Her potassium level was initially 5.1 mmol/L and then increased to 5.5 mmol/L. After adopting a low potassium diet, it decreased to 4.9 mmol/L. She is concerned about the potential for her potassium levels to rise again, especially since she is not scheduled for another blood test soon.  She mentions a dietary change where she consumed more bananas than usual for about a week before the blood test that showed a potassium level of 5.5 mmol/L. However, she notes that the level of 5.1 mmol/L occurred before this dietary change.  She is not currently on any medications known to increase potassium levels, such as potassium-sparing diuretics or certain blood pressure medications. Her kidney function is reported to be good, which is relevant as kidney disorders can cause elevated potassium levels.  She shares an anecdote about a friend who experienced a dangerously high potassium level of 7 mmol/L, which required immediate medical attention. This has heightened her awareness and concern about her own potassium levels.         Review of Systems Neg ros    Objective:   Physical Exam  General- No acute distress. Pleasant patient. Lungs- Clear, even and unlabored. Heart- regular rate and rhythm. Neurologic- CNII- XII grossly intact.       Assessment & Plan:  Assessment and Plan    Hyperkalemia Recent labs showed elevated potassium levels (5.1, 5.5, 4.9). No known kidney disorders or medications that could contribute to hyperkalemia. Possible  dietary cause, including increased banana consumption. -Repeat metabolic panel in one week to monitor potassium levels. -Advise patient to maintain normal diet, but avoid excessive potassium-rich foods. -Ensure patient is well-hydrated prior to blood draw to avoid difficult draw.   Follow up date to be determined after lab review.

## 2023-07-12 ENCOUNTER — Encounter: Payer: Self-pay | Admitting: Medical

## 2023-07-12 ENCOUNTER — Other Ambulatory Visit (INDEPENDENT_AMBULATORY_CARE_PROVIDER_SITE_OTHER): Payer: Medicare Other

## 2023-07-12 DIAGNOSIS — E875 Hyperkalemia: Secondary | ICD-10-CM | POA: Diagnosis not present

## 2023-07-12 LAB — COMPREHENSIVE METABOLIC PANEL
ALT: 12 U/L (ref 0–35)
AST: 14 U/L (ref 0–37)
Albumin: 4 g/dL (ref 3.5–5.2)
Alkaline Phosphatase: 66 U/L (ref 39–117)
BUN: 12 mg/dL (ref 6–23)
CO2: 29 meq/L (ref 19–32)
Calcium: 8.9 mg/dL (ref 8.4–10.5)
Chloride: 100 meq/L (ref 96–112)
Creatinine, Ser: 0.73 mg/dL (ref 0.40–1.20)
GFR: 78.27 mL/min (ref >60.00)
Glucose, Bld: 98 mg/dL (ref 70–99)
Potassium: 3.9 meq/L (ref 3.5–5.1)
Sodium: 136 meq/L (ref 135–145)
Total Bilirubin: 0.5 mg/dL (ref 0.2–1.2)
Total Protein: 6.3 g/dL (ref 6.0–8.3)

## 2023-10-12 DIAGNOSIS — L821 Other seborrheic keratosis: Secondary | ICD-10-CM | POA: Diagnosis not present

## 2023-10-12 DIAGNOSIS — L578 Other skin changes due to chronic exposure to nonionizing radiation: Secondary | ICD-10-CM | POA: Diagnosis not present

## 2023-10-12 DIAGNOSIS — L219 Seborrheic dermatitis, unspecified: Secondary | ICD-10-CM | POA: Diagnosis not present

## 2023-10-12 DIAGNOSIS — D225 Melanocytic nevi of trunk: Secondary | ICD-10-CM | POA: Diagnosis not present

## 2023-10-12 DIAGNOSIS — L570 Actinic keratosis: Secondary | ICD-10-CM | POA: Diagnosis not present

## 2023-10-21 ENCOUNTER — Ambulatory Visit (INDEPENDENT_AMBULATORY_CARE_PROVIDER_SITE_OTHER): Admitting: Family Medicine

## 2023-10-21 ENCOUNTER — Encounter: Payer: Self-pay | Admitting: Family Medicine

## 2023-10-21 VITALS — BP 126/74 | HR 87 | Temp 97.4°F | Resp 16 | Ht 64.0 in | Wt 144.2 lb

## 2023-10-21 DIAGNOSIS — J0140 Acute pansinusitis, unspecified: Secondary | ICD-10-CM

## 2023-10-21 MED ORDER — FLUCONAZOLE 150 MG PO TABS
ORAL_TABLET | ORAL | 0 refills | Status: DC
Start: 2023-10-21 — End: 2024-03-20

## 2023-10-21 MED ORDER — DOXYCYCLINE HYCLATE 100 MG PO TABS
100.0000 mg | ORAL_TABLET | Freq: Two times a day (BID) | ORAL | 0 refills | Status: AC
Start: 2023-10-21 — End: 2023-10-28

## 2023-10-21 NOTE — Progress Notes (Signed)
 Chief Complaint  Patient presents with   Sinus Problem    Sinus Problem    Erin Sampson here for URI complaints.  Duration: 2 weeks - worsening Associated symptoms: sinus headache, sinus congestion, sinus pain, rhinorrhea, itchy watery eyes, ear fullness, ear pain, hoarseness, and upper dental pain Denies: ear drainage, sore throat, wheezing, shortness of breath, myalgia, and fevers, coughing Treatment to date: Zyrtec, Vit C Sick contacts: No  Past Medical History:  Diagnosis Date   Allergy    Bursitis    Depression    Diverticulitis    Dyslipidemia 01/04/2023   IBS (irritable bowel syndrome)    Thyroid  nodule 12/31/2022    Objective BP 126/74 (BP Location: Left Arm, Patient Position: Sitting)   Pulse 87   Temp (!) 97.4 F (36.3 C) (Oral)   Resp 16   Ht 5\' 4"  (1.626 m)   Wt 144 lb 3.2 oz (65.4 kg)   SpO2 98%   BMI 24.75 kg/m  General: Awake, alert, appears stated age HEENT: AT, [place], ears patent b/l and TM's neg, nares patent w/o discharge, pharynx pink and without exudates, MMM, +ttp over max and frontal sinuses b/l Neck: No masses or asymmetry Heart: RRR Lungs: CTAB, no accessory muscle use Psych: Age appropriate judgment and insight, normal mood and affect  Acute non-recurrent pansinusitis - Plan: doxycycline (VIBRA-TABS) 100 MG tablet, fluconazole (DIFLUCAN) 150 MG tablet  Given duration and worsening, will tx w 7 d of abx. Diflucan prn. Continue to push fluids, practice good hand hygiene, cover mouth when coughing. F/u prn. If starting to experience fevers, shaking, or shortness of breath, seek immediate care. Pt voiced understanding and agreement to the plan.  Shellie Dials [place], DO 10/21/23 1:01 PM

## 2023-10-21 NOTE — Patient Instructions (Addendum)
 Continue to push fluids, practice good hand hygiene.  If you start having fevers, shaking or shortness of breath, seek immediate care.  Go back on the Flonase .   OK to take Tylenol 1000 mg (2 extra strength tabs) or 975 mg (3 regular strength tabs) every 6 hours as needed.  Let us  know if you need anything.

## 2023-11-09 ENCOUNTER — Ambulatory Visit (INDEPENDENT_AMBULATORY_CARE_PROVIDER_SITE_OTHER): Admitting: Medical

## 2023-11-09 VITALS — BP 138/78 | HR 74 | Temp 97.9°F | Resp 18 | Ht 64.0 in | Wt 147.0 lb

## 2023-11-09 DIAGNOSIS — R0982 Postnasal drip: Secondary | ICD-10-CM | POA: Diagnosis not present

## 2023-11-09 DIAGNOSIS — R591 Generalized enlarged lymph nodes: Secondary | ICD-10-CM | POA: Diagnosis not present

## 2023-11-09 DIAGNOSIS — J040 Acute laryngitis: Secondary | ICD-10-CM | POA: Diagnosis not present

## 2023-11-09 DIAGNOSIS — H6993 Unspecified Eustachian tube disorder, bilateral: Secondary | ICD-10-CM

## 2023-11-09 LAB — CBC WITH DIFFERENTIAL/PLATELET
Absolute Lymphocytes: 1630 {cells}/uL (ref 850–3900)
Absolute Monocytes: 504 {cells}/uL (ref 200–950)
Basophils Absolute: 50 {cells}/uL (ref 0–200)
Basophils Relative: 0.9 %
Eosinophils Absolute: 78 {cells}/uL (ref 15–500)
Eosinophils Relative: 1.4 %
HCT: 44.7 % (ref 35.0–45.0)
Hemoglobin: 14.2 g/dL (ref 11.7–15.5)
MCH: 29.7 pg (ref 27.0–33.0)
MCHC: 31.8 g/dL — ABNORMAL LOW (ref 32.0–36.0)
MCV: 93.5 fL (ref 80.0–100.0)
MPV: 10.4 fL (ref 7.5–12.5)
Monocytes Relative: 9 %
Neutro Abs: 3338 {cells}/uL (ref 1500–7800)
Neutrophils Relative %: 59.6 %
Platelets: 299 10*3/uL (ref 140–400)
RBC: 4.78 10*6/uL (ref 3.80–5.10)
RDW: 12.3 % (ref 11.0–15.0)
Total Lymphocyte: 29.1 %
WBC: 5.6 10*3/uL (ref 3.8–10.8)

## 2023-11-09 MED ORDER — FLUCONAZOLE 150 MG PO TABS
150.0000 mg | ORAL_TABLET | Freq: Every day | ORAL | 0 refills | Status: DC
Start: 2023-11-09 — End: 2024-03-20

## 2023-11-09 NOTE — Progress Notes (Signed)
 Subjective:    Patient ID: Erin Sampson, female    DOB: September 11, 1945, 78 y.o.   MRN: [id number]  HPI   JUNKO OHAGAN is a 78 year old female who presents with persistent hoarseness and ear pain following a sinus infection.  About a month ago, she was treated with doxycycline  for a severe sinus infection. She completed a seven-day course of the antibiotic, which alleviated the sinus pain, but she continues to experience a raspy and scratchy voice. Her voice feels 'coated' and she frequently needs to clear her throat, especially in the morning. The hoarseness has persisted for about a month and a half, even before the sinus infection, and improves slightly as the day progresses.  She also experiences bilateral ear pain and a sensation of pressure, which initially affected only her left ear during the sinus infection but has since moved to her right ear as well. The pain originates in her neck and radiates to both ears, but there is no sore throat or pain upon swallowing. She has a history of esophageal dilation approximately 25 years ago due to constriction but denies current choking or difficulty swallowing.  She has a history of smoking for about 30 years, having quit at age 42. She has been using Flonase  intermittently, typically in conjunction with antibiotics, but has not continued its use consistently. No current difficulty swallowing, sore throat, or regular choking on food. Reports postnasal drainage and bilateral ear pain with a sensation of pressure.  No recent antibiotic since treated by Dr. Gwenette Lennox.   Review of Systems  Constitutional:  Negative for fatigue and fever.  HENT:  Positive for ear pain and voice change.   Respiratory:  Negative for cough, chest tightness and wheezing.   Cardiovascular:  Negative for chest pain and palpitations.  Gastrointestinal:  Negative for abdominal pain and blood in stool.  Neurological:  Negative for dizziness and headaches.   Hematological:  Positive for adenopathy.  Psychiatric/Behavioral:  Negative for behavioral problems and decreased concentration.    Past Medical History:  Diagnosis Date   Allergy    Bursitis    Depression    Diverticulitis    Dyslipidemia 01/04/2023   IBS (irritable bowel syndrome)    Thyroid  nodule 12/31/2022     Social History   Socioeconomic History   Marital status: Married    Spouse name: Not on file   Number of children: Not on file   Years of education: Not on file   Highest education level: Not on file  Occupational History   Occupation: retired  Tobacco Use   Smoking status: Former    Current packs/day: 0.00    Average packs/day: 1 pack/day for 20.0 years (20.0 ttl pk-yrs)    Types: Cigarettes    Start date: 03/16/1963    Quit date: 03/16/1983    Years since quitting: 40.6   Smokeless tobacco: Never   Tobacco comments:    Quit at age 69   Vaping Use   Vaping status: Never Used  Substance and Sexual Activity   Alcohol use: No   Drug use: No   Sexual activity: Not Currently  Other Topics Concern   Not on file  Social History Narrative   Married, with 2 children   2 yrs of college   Right handed   3-4 cups daily   Social Drivers of Health   Financial Resource Strain: Low Risk  (02/15/2023)   Overall Financial Resource Strain (CARDIA)    Difficulty of Paying  Living Expenses: Not hard at all  Food Insecurity: No Food Insecurity (02/15/2023)   Hunger Vital Sign    Worried About Running Out of Food in the Last Year: Never true    Ran Out of Food in the Last Year: Never true  Transportation Needs: No Transportation Needs (02/15/2023)   PRAPARE - Administrator, Civil Service (Medical): No    Lack of Transportation (Non-Medical): No  Physical Activity: Inactive (02/15/2023)   Exercise Vital Sign    Days of Exercise per Week: 0 days    Minutes of Exercise per Session: 0 min  Stress: No Stress Concern Present (02/15/2023)   [employer] of  Occupational Health - Occupational Stress Questionnaire    Feeling of Stress : Not at all  Social Connections: Socially Integrated (02/15/2023)   Social Connection and Isolation Panel [NHANES]    Frequency of Communication with Friends and Family: More than three times a week    Frequency of Social Gatherings with Friends and Family: More than three times a week    Attends Religious Services: More than 4 times per year    Active Member of [employer] or Organizations: Yes    Attends Banker Meetings: More than 4 times per year    Marital Status: Married  Catering manager Violence: Not At Risk (02/15/2023)   Humiliation, Afraid, Rape, and Kick questionnaire    Fear of Current or Ex-Partner: No    Emotionally Abused: No    Physically Abused: No    Sexually Abused: No    Past Surgical History:  Procedure Laterality Date   ABDOMINAL HYSTERECTOMY     for bleeding/polyps   APPENDECTOMY      No family history on file.  Allergies  Allergen Reactions   Codeine Itching   Tea Tree Oil Other (See Comments)    Dizzy    Other Palpitations    Clorox    Current Outpatient Medications on File Prior to Visit  Medication Sig Dispense Refill   aspirin 81 MG tablet Take 325 mg by mouth daily.     B Complex Vitamins (B COMPLEX 1 PO) Take 1 tablet by mouth daily.     calcium carbonate (OS-CAL) 600 MG TABS tablet Take 600 mg by mouth 2 (two) times daily with a meal.     cholecalciferol (VITAMIN D) 1000 UNITS tablet Take 5,000 Units by mouth daily.     fluconazole  (DIFLUCAN ) 150 MG tablet Take 1 tab, repeat in 72 hours if no improvement. 2 tablet 0   fluticasone  (FLONASE ) 50 MCG/ACT nasal spray Place 2 sprays into both nostrils daily. 16 g 1   magnesium gluconate (MAGONATE) 500 MG tablet Take 500 mg by mouth 2 (two) times daily.     Turmeric 1053 MG TABS Take 1 tablet by mouth daily.     No current facility-administered medications on file prior to visit.    BP 138/78   Pulse 74    Temp 97.9 F (36.6 C)   Resp 18   Ht 5' 4 (1.626 m)   Wt 147 lb (66.7 kg)   SpO2 98%   BMI 25.23 kg/m        Objective:   Physical Exam  General Mental Status- Alert. General Appearance- Not in acute distress.   Skin General: Color- Normal Color. Moisture- Normal Moisture.  Neck Carotid Arteries- Normal color. Moisture- Normal Moisture. No carotid bruits. No JVD.  Chest and Lung Exam Auscultation: Breath Sounds:-Normal.  Cardiovascular Auscultation:Rythm- Regular. Murmurs &  Other Heart Sounds:Auscultation of the heart reveals- No Murmurs.  Abdomen Inspection:-Inspeection Normal. Palpation/Percussion:Note:No mass. Palpation and Percussion of the abdomen reveal- Non Tender, Non Distended + BS, no rebound or guarding.   Neurologic Cranial Nerve exam:- CN III-XII intact(No nystagmus), symmetric smile. Strength:- 5/5 equal and symmetric strength both upper and lower extremities.   Heent- no sinus pressure. Mild nasal congested and hoarse voic. Normal pharaynx. Tm normal and canals clear. Normal thyroid  on palpation. Mild palpable and faint tender submandibular nodes.    Assessment & Plan:   Patient Instructions  Chronic hoarseness Persistent hoarseness for 1.5-2 months. Smoking history raises concern for laryngeal pathology. Laryngoscopy by ENT probably indicated. - Refer to ENT for laryngoscopy. - Order CBC to assess infection-related lymphadenopathy.  Eustachian tube dysfunction Bilateral ear pressure and pain post-sinus infection. No infection signs, but pressure suggests dysfunction. Flonase  may reduce symptoms. - Recommend Flonase  for symptom relief.  Remote esophageal stenosis Esophageal dilation 25 years ago. No current dysphagia, but slow movement sensation with hot liquids. Monitor for recurrence. - Report new pain with swallowing or dysphagia for GI referral.  Follow-up ENT referral placed. Monitoring of thyroid  area advised. Instructed to report  worsening symptoms. - Follow up with ENT within 3 weeks. - Contact ENT if not contacted within 7 days. - Monitor thyroid  area for tenderness. - Report worsening symptoms before ENT appointment.   Veneta Sliter, PA-C

## 2023-11-09 NOTE — Patient Instructions (Addendum)
 Chronic hoarseness Persistent hoarseness for 1.5-2 months. Smoking history raises concern for laryngeal pathology. Laryngoscopy by ENT probably indicated. - Refer to ENT for laryngoscopy. - Order CBC to assess infection-related lymphadenopathy.  Eustachian tube dysfunction Bilateral ear pressure and pain post-sinus infection. No infection signs, but pressure suggests dysfunction. Flonase  may reduce symptoms. - Recommend Flonase  for symptom relief.  Remote esophageal stenosis Esophageal dilation 25 years ago. No current dysphagia, but slow movement sensation with hot liquids. Monitor for recurrence. - Report new pain with swallowing or dysphagia for GI referral.  Follow-up ENT referral placed. Monitoring of thyroid  area advised. Instructed to report worsening symptoms. - Follow up with ENT within 3 weeks.(Hopefully can be seen by then) - Contact ENT if not contacted within 7 days. - Monitor thyroid  area for tenderness. - Report worsening symptoms before ENT appointment.  Pt thinks she might have thrush post doxy though not exam. She expresses concern offered diflucan  1 tab for one day. Dr. Gwenette Lennox rx'd and pt has filed but not taken so she won't pick up my rx that I sent.

## 2023-11-10 ENCOUNTER — Ambulatory Visit: Payer: Self-pay | Admitting: Medical

## 2023-11-18 ENCOUNTER — Encounter (INDEPENDENT_AMBULATORY_CARE_PROVIDER_SITE_OTHER): Payer: Self-pay | Admitting: Otolaryngology

## 2023-11-18 ENCOUNTER — Ambulatory Visit (INDEPENDENT_AMBULATORY_CARE_PROVIDER_SITE_OTHER): Admitting: Otolaryngology

## 2023-11-18 VITALS — BP 132/76 | HR 87 | Ht 65.0 in | Wt 145.0 lb

## 2023-11-18 DIAGNOSIS — Z87891 Personal history of nicotine dependence: Secondary | ICD-10-CM | POA: Diagnosis not present

## 2023-11-18 DIAGNOSIS — R490 Dysphonia: Secondary | ICD-10-CM

## 2023-11-18 DIAGNOSIS — J342 Deviated nasal septum: Secondary | ICD-10-CM

## 2023-11-18 DIAGNOSIS — R0981 Nasal congestion: Secondary | ICD-10-CM

## 2023-11-18 DIAGNOSIS — J040 Acute laryngitis: Secondary | ICD-10-CM

## 2023-11-18 DIAGNOSIS — R0989 Other specified symptoms and signs involving the circulatory and respiratory systems: Secondary | ICD-10-CM | POA: Diagnosis not present

## 2023-11-18 DIAGNOSIS — J383 Other diseases of vocal cords: Secondary | ICD-10-CM | POA: Diagnosis not present

## 2023-11-18 DIAGNOSIS — H938X3 Other specified disorders of ear, bilateral: Secondary | ICD-10-CM

## 2023-11-18 DIAGNOSIS — H6993 Unspecified Eustachian tube disorder, bilateral: Secondary | ICD-10-CM

## 2023-11-18 DIAGNOSIS — R0982 Postnasal drip: Secondary | ICD-10-CM | POA: Diagnosis not present

## 2023-11-18 MED ORDER — FLUTICASONE PROPIONATE 50 MCG/ACT NA SUSP: 2.0000 | Freq: Two times a day (BID) | NASAL | 1 refills | Status: DC

## 2023-11-18 MED ORDER — AZELASTINE HCL 0.1 % NA SOLN: 2.0000 | Freq: Two times a day (BID) | NASAL | 12 refills | Status: DC

## 2023-11-18 NOTE — Progress Notes (Signed)
 Dear Dr. Lari Pleva, Here is my assessment for our mutual patient, Erin Sampson. Thank you for allowing me the opportunity to care for your patient. Please do not hesitate to contact me should you have any other questions. Sincerely, Dr. Milon Aloe  Otolaryngology Clinic Note Referring provider: Dr. Lari Pleva HPI:  Erin Sampson is a 78 y.o. female kindly referred by Dr. [name] for evaluation of dysphonia and ear pressure  Initial visit (10/2023): Patient reports: she had a URI and was diagnosed with a sinus infection about 6 weeks ago, with discolored drainage/pressure, dysphonia and nasal congestion and ear pressure (bilateral). She was prescribed doxycycline , and this improved her nasal symptoms significantly, but continues to report dysphonia and bilateral ear pressure (hearing in a tunnel) and some PND as well as throat clearing (feels like vocal cords are coated in something). Voice is getting better (worse with use and better at end of day). Never fully lost her voice. No cough currently. No nasal obstruction. She is slowly improving but symptoms persist.   Patient otherwise denies: - dysphagia, odynophagia, PNA, unintentional weight loss - shortness of breath, hemoptysis - ear pain, neck masses, vertigo, ear drainage, tinnitus - No recent intubations  Has chronic bilateral long-standing hearing loss. Last hearing test many years ago, denies significant noise exposure.  H&N Surgery: no Personal or FHx of bleeding dz or anesthesia difficulty: no  AP/AC: ASA 81  Tobacco: prior (30 pack year history), quit 30 years ago.   PMHx: HLD, TIA, Palpitations  Independent Review of Additional Tests or Records:  Sylvia Everts (11/09/2023): noted persistent hoarseness nad ear discomfort after sinus infection - sinus infection in May, treated with doxy, but continued scratchy voice; frequent throat clearing; slight improvement as day progreses; No sore throat. No trouble swallowing; Dx:  Chronic hoarseness and ETD; Rx: ref to ENT, flonase  Dr. Gwenette Lennox (10/21/2023): noted URI, worsening sx with sinonasal symptoms; Dx: Sinusitis; Rx: doxy Labs 11/09/2023 CBC: WBC 5.6, Eos 78 PMH/Meds/All/SocHx/FamHx/ROS:   Past Medical History:  Diagnosis Date   Allergy    Bursitis    Depression    Diverticulitis    Dyslipidemia 01/04/2023   IBS (irritable bowel syndrome)    Thyroid  nodule 12/31/2022     Past Surgical History:  Procedure Laterality Date   ABDOMINAL HYSTERECTOMY     for bleeding/polyps   APPENDECTOMY      History reviewed. No pertinent family history.   Social Connections: Socially Integrated (02/15/2023)   Social Connection and Isolation Panel    Frequency of Communication with Friends and Family: More than three times a week    Frequency of Social Gatherings with Friends and Family: More than three times a week    Attends Religious Services: More than 4 times per year    Active Member of Clubs or Organizations: Yes    Attends Engineer, structural: More than 4 times per year    Marital Status: Married      Current Outpatient Medications:    aspirin 81 MG tablet, Take 325 mg by mouth daily., Disp: , Rfl:    azelastine (ASTELIN) 0.1 % nasal spray, Place 2 sprays into both nostrils 2 (two) times daily. Use in each nostril as directed, Disp: 30 mL, Rfl: 12   B Complex Vitamins (B COMPLEX 1 PO), Take 1 tablet by mouth daily., Disp: , Rfl:    calcium carbonate (OS-CAL) 600 MG TABS tablet, Take 600 mg by mouth 2 (two) times daily with a meal., Disp: , Rfl:  cholecalciferol (VITAMIN D) 1000 UNITS tablet, Take 5,000 Units by mouth daily., Disp: , Rfl:    fluconazole  (DIFLUCAN ) 150 MG tablet, Take 1 tab, repeat in 72 hours if no improvement., Disp: 2 tablet, Rfl: 0   magnesium gluconate (MAGONATE) 500 MG tablet, Take 500 mg by mouth 2 (two) times daily., Disp: , Rfl:    Turmeric 1053 MG TABS, Take 1 tablet by mouth daily., Disp: , Rfl:    fluconazole  (DIFLUCAN )  150 MG tablet, Take 1 tablet (150 mg total) by mouth daily. (Patient not taking: Reported on 11/18/2023), Disp: 1 tablet, Rfl: 0   fluticasone  (FLONASE ) 50 MCG/ACT nasal spray, Place 2 sprays into both nostrils in the morning and at bedtime., Disp: 16 g, Rfl: 1   Physical Exam:   BP 132/76 (BP Location: Left Arm, Patient Position: Sitting, Cuff Size: Normal)   Pulse 87   Ht 5' 5 (1.651 m)   Wt 145 lb (65.8 kg)   SpO2 96%   BMI 24.13 kg/m   Salient findings:  CN II-XII intact  Bilateral EAC clear and TM intact with well pneumatized middle ear spaces Anterior rhinoscopy: Septum deviates right; bilateral inferior turbinates without significant hypertrophy; Nasal endoscopy was indicated to better evaluate the nose and paranasal sinuses, given the patient's history and exam findings, and is detailed below. No lesions of oral cavity/oropharynx No obviously palpable neck masses/lymphadenopathy/thyromegaly No respiratory distress or stridor; voice quality class 2 - slight harshness; TFL was indicated to better evaluate the proximal airway, given the patient's history and exam findings, and is detailed below.  Seprately Identifiable Procedures:  Prior to initiating any procedures, risks/benefits/alternatives were explained to the patient and verbal consent obtained. Procedure Note Pre-procedure diagnosis:  Dysphonia, throat clearing Post-procedure diagnosis: Same Procedure: Transnasal Fiberoptic Laryngoscopy, CPT 31575 - Mod 25 Indication: see a Complications: None apparent EBL: 0 mL  The procedure was undertaken to further evaluate the patient's complaint above, with mirror exam inadequate for appropriate examination due to gag reflex and poor patient tolerance  Procedure:  Patient was identified as correct patient. Verbal consent was obtained. The nose was sprayed with oxymetazoline and 4% lidocaine. The The flexible laryngoscope was passed through the nose to view the nasal cavity, pharynx  (oropharynx, hypopharynx) and larynx.  The larynx was examined at rest and during multiple phonatory tasks. Documentation was obtained and reviewed with patient. The scope was removed. The patient tolerated the procedure well.  Findings: The nasal cavity and nasopharynx did not reveal any masses or lesions, mucosa appeared to be without obvious lesions. The tongue base, pharyngeal walls, piriform sinuses, vallecula, epiglottis and postcricoid region are normal in appearance; mild age appropriate atrophy, no significant retained secretions; The visualized portion of the subglottis and proximal trachea is widely patent. The vocal folds are mobile bilaterally. There are no lesions on the free edge of the vocal folds nor elsewhere in the larynx worrisome for malignancy.        Electronically signed by: Evelina Hippo, MD 11/18/2023 2:12 PM   PROCEDURE: Bilateral Diagnostic Rigid Nasal Endoscopy Pre-procedure diagnosis: Nasal congestion, post nasal drip Post-procedure diagnosis: same Indication: See pre-procedure diagnosis and physical exam above Complications: None apparent EBL: 0 mL Anesthesia: Lidocaine 4% and topical decongestant was topically sprayed in each nasal cavity  Description of Procedure:  Patient was identified. A rigid 30 degree endoscope was utilized to evaluate the sinonasal cavities, mucosa, sinus ostia and turbinates and septum.  Overall, signs of mucosal inflammation are not noted.  Also  noted are right septal deviation with a large septal spur.  No mucopurulence, polyps, or masses noted.   Right Middle meatus: clear Right SE Recess: clear Left MM: clear Left SE Recess: clear No masses over ET orifices b/l   Photodocumentation was obtained.  CPT CODE -- 78469 - Mod 25   Impression & Plans:  Maraya Gwilliam is a 78 y.o. female with:  1. Nasal septal deviation   2. Nasal congestion   3. Post-nasal drip   4. Dysphonia   5. Vocal fold atrophy   6. Chronic throat  clearing   7. Sensation of fullness in both ears   8. Dysfunction of both eustachian tubes    Multiple complaints, most likely stemming from her recent URI and resultant laryngitis and ETD. TFL and rigid endo reassuring. Still with some resultant congestion, PND, and improving dysphonia. Some throat clearing and ear pressure.  Expect she will do well with conservative course of management. - Will start flonase  and astelin BID with autoinsufflation - Avoid throat clearing; swallow instead. Improve laryngeal hydration She will call us  if she is not getting better in 6-8 weeks  See below regarding exact medications prescribed this encounter including dosages and route: Meds ordered this encounter  Medications   fluticasone  (FLONASE ) 50 MCG/ACT nasal spray    Sig: Place 2 sprays into both nostrils in the morning and at bedtime.    Dispense:  16 g    Refill:  1   azelastine (ASTELIN) 0.1 % nasal spray    Sig: Place 2 sprays into both nostrils 2 (two) times daily. Use in each nostril as directed    Dispense:  30 mL    Refill:  12      Thank you for allowing me the opportunity to care for your patient. Please do not hesitate to contact me should you have any other questions.  Sincerely, Milon Aloe, MD Otolaryngologist (ENT), [facility] Phone: [phone] Fax: [phone]  11/18/2023, 2:12 PM   MDM:  Level 4 - [phone] Complexity/Problems addressed: mod - multiple complaints Data complexity: mod - independent review of notes, labs - Morbidity: mod  - Drug prescribed or managed: y

## 2023-11-18 NOTE — Patient Instructions (Signed)
 Use two sprays of flonase  in each nostril twice per day  right after, use astelin spray two sprays each nostril twice per day When you think about clearing your throat, take a sip of water instead. Keep a candy handy.

## 2024-02-13 ENCOUNTER — Encounter (HOSPITAL_BASED_OUTPATIENT_CLINIC_OR_DEPARTMENT_OTHER): Payer: Self-pay | Admitting: Medical

## 2024-02-13 DIAGNOSIS — Z139 Encounter for screening, unspecified: Secondary | ICD-10-CM

## 2024-02-15 ENCOUNTER — Telehealth: Payer: Self-pay

## 2024-02-15 NOTE — Telephone Encounter (Signed)
 Patient wants to know if she can go ahead and have her mammogram order placed. She is scheduled for her annual on 11/13 but due for mammo in October.

## 2024-02-16 ENCOUNTER — Ambulatory Visit

## 2024-03-20 ENCOUNTER — Ambulatory Visit (INDEPENDENT_AMBULATORY_CARE_PROVIDER_SITE_OTHER): Admitting: Family

## 2024-03-20 VITALS — BP 135/56 | HR 75 | Temp 98.0°F | Resp 16 | Ht 64.0 in | Wt 149.0 lb

## 2024-03-20 DIAGNOSIS — Z23 Encounter for immunization: Secondary | ICD-10-CM | POA: Diagnosis not present

## 2024-03-20 DIAGNOSIS — R21 Rash and other nonspecific skin eruption: Secondary | ICD-10-CM

## 2024-03-20 MED ORDER — VALACYCLOVIR HCL 1 G PO TABS
1000.0000 mg | ORAL_TABLET | Freq: Three times a day (TID) | ORAL | 0 refills | Status: AC
Start: 2024-03-20 — End: 2024-03-30

## 2024-03-20 NOTE — Progress Notes (Signed)
 Subjective:     Patient ID: Erin Sampson, female    DOB: 1945/08/03, 78 y.o.   MRN: [id number]  Chief Complaint  Patient presents with   Rash    Patient reports having rash on right leg    Rash    Discussed the use of AI scribe software for clinical note transcription with the patient, who gave verbal consent to proceed.  History of Present Illness   Erin Sampson is an 78 year old female who presents with a rash on her right leg. Overnight, she developed a rash on her right leg, initially presenting as one bump with significant itching. After scratching, it evolved into two bumps resembling 'little warts'. The rash continues to itch but does not cause pain. She applied cortisone cream, which has reduced the itching. She is concerned about the possibility of shingles, as her husband had shingles about two weeks ago, although she did not have direct contact with his rash.  She has not yet had the Shingrix vaccine but plans to do so after this rash heals.  She did have zostavax back in 2017.     Health Maintenance Due  Topic Date Due   Zoster Vaccines- Shingrix (1 of 2) 03/17/1963   Influenza Vaccine  12/30/2023   Medicare Annual Wellness (AWV)  02/15/2024    Past Medical History:  Diagnosis Date   Allergy    Bursitis    Depression    Diverticulitis    Dyslipidemia 01/04/2023   IBS (irritable bowel syndrome)    Thyroid  nodule 12/31/2022    Past Surgical History:  Procedure Laterality Date   ABDOMINAL HYSTERECTOMY     for bleeding/polyps   APPENDECTOMY      No family history on file.  Social History   Socioeconomic History   Marital status: Married    Spouse name: Not on file   Number of children: Not on file   Years of education: Not on file   Highest education level: Not on file  Occupational History   Occupation: retired  Tobacco Use   Smoking status: Former    Current packs/day: 0.00    Average packs/day: 1 pack/day for 20.0 years (20.0 ttl  pk-yrs)    Types: Cigarettes    Start date: 03/16/1963    Quit date: 03/16/1983    Years since quitting: 41.0   Smokeless tobacco: Never   Tobacco comments:    Quit at age 28   Vaping Use   Vaping status: Never Used  Substance and Sexual Activity   Alcohol use: No   Drug use: No   Sexual activity: Not Currently  Other Topics Concern   Not on file  Social History Narrative   Married, with 2 children   2 yrs of college   Right handed   3-4 cups daily   Social Drivers of Health   Financial Resource Strain: Low Risk  (02/15/2023)   Overall Financial Resource Strain (CARDIA)    Difficulty of Paying Living Expenses: Not hard at all  Food Insecurity: No Food Insecurity (02/15/2023)   Hunger Vital Sign    Worried About Running Out of Food in the Last Year: Never true    Ran Out of Food in the Last Year: Never true  Transportation Needs: No Transportation Needs (02/15/2023)   PRAPARE - Administrator, Civil Service (Medical): No    Lack of Transportation (Non-Medical): No  Physical Activity: Inactive (02/15/2023)   Exercise Vital Sign  Days of Exercise per Week: 0 days    Minutes of Exercise per Session: 0 min  Stress: No Stress Concern Present (02/15/2023)   [employer] of Occupational Health - Occupational Stress Questionnaire    Feeling of Stress : Not at all  Social Connections: Socially Integrated (02/15/2023)   Social Connection and Isolation Panel    Frequency of Communication with Friends and Family: More than three times a week    Frequency of Social Gatherings with Friends and Family: More than three times a week    Attends Religious Services: More than 4 times per year    Active Member of [employer] or Organizations: Yes    Attends Engineer, structural: More than 4 times per year    Marital Status: Married  Catering manager Violence: Not At Risk (02/15/2023)   Humiliation, Afraid, Rape, and Kick questionnaire    Fear of Current or Ex-Partner: No     Emotionally Abused: No    Physically Abused: No    Sexually Abused: No    Outpatient Medications Prior to Visit  Medication Sig Dispense Refill   aspirin 81 MG tablet Take 325 mg by mouth daily.     B Complex Vitamins (B COMPLEX 1 PO) Take 1 tablet by mouth daily.     calcium carbonate (OS-CAL) 600 MG TABS tablet Take 600 mg by mouth 2 (two) times daily with a meal.     cholecalciferol (VITAMIN D) 1000 UNITS tablet Take 5,000 Units by mouth daily.     magnesium gluconate (MAGONATE) 500 MG tablet Take 500 mg by mouth 2 (two) times daily.     Turmeric 1053 MG TABS Take 1 tablet by mouth daily.     azelastine  (ASTELIN ) 0.1 % nasal spray Place 2 sprays into both nostrils 2 (two) times daily. Use in each nostril as directed 30 mL 12   fluconazole  (DIFLUCAN ) 150 MG tablet Take 1 tab, repeat in 72 hours if no improvement. 2 tablet 0   fluconazole  (DIFLUCAN ) 150 MG tablet Take 1 tablet (150 mg total) by mouth daily. (Patient not taking: Reported on 11/18/2023) 1 tablet 0   fluticasone  (FLONASE ) 50 MCG/ACT nasal spray Place 2 sprays into both nostrils in the morning and at bedtime. 16 g 1   No facility-administered medications prior to visit.    Allergies  Allergen Reactions   Codeine Itching   Tea Tree Oil Other (See Comments)    Dizzy    Other Palpitations    Clorox    Review of Systems  Skin:  Positive for rash.       Objective:    Physical Exam Constitutional:      Appearance: Normal appearance.  Cardiovascular:     Rate and Rhythm: Normal rate.  Pulmonary:     Effort: Pulmonary effort is normal.  Skin:    Comments: Several ulcerated lesions noted right anterior thigh  Neurological:     Mental Status: She is alert.       BP (!) 135/56 (BP Location: Right Arm, Patient Position: Sitting, Cuff Size: Small)   Pulse 75   Temp 98 F (36.7 C) (Oral)   Resp 16   Ht 5' 4 (1.626 m)   Wt 149 lb (67.6 kg)   SpO2 100%   BMI 25.58 kg/m  Wt Readings from Last 3  Encounters:  03/20/24 149 lb (67.6 kg)  11/18/23 145 lb (65.8 kg)  11/09/23 147 lb (66.7 kg)       Assessment & Plan:  Problem List Items Addressed This Visit   None Visit Diagnoses       Rash    -  Primary   Relevant Medications   valACYclovir (VALTREX) 1000 MG tablet     Needs flu shot       Relevant Orders   Flu vaccine HIGH DOSE PF(Fluzone Trivalent)     Intensely pruritic.  Not clear if this is a developing zoster rash at this point.  However she is concerned about worsening as she recently saw her husband deal with shingles. Will rx empiric valtrex. She has normal renal function. She is advised to reach out if increase pain/worsening rash.   I have discontinued Erin L. Schmit Pat's fluconazole , fluconazole , fluticasone , and azelastine . I am also having her start on valACYclovir. Additionally, I am having her maintain her calcium carbonate, cholecalciferol, aspirin, B Complex Vitamins (B COMPLEX 1 PO), Turmeric, and magnesium gluconate.  Meds ordered this encounter  Medications   valACYclovir (VALTREX) 1000 MG tablet    Sig: Take 1 tablet (1,000 mg total) by mouth 3 (three) times daily for 10 days.    Dispense:  30 tablet    Refill:  0    Supervising Provider:   DOMENICA BLACKBIRD A [[id number]]

## 2024-03-20 NOTE — Patient Instructions (Signed)
 VISIT SUMMARY:  Today, you came in with a rash on your right leg that started as one itchy bump and then turned into two bumps. You were concerned it might be shingles.  YOUR PLAN:  RASH ON RIGHT LEG, POSSIBLE HERPES ZOSTER (SHINGLES): You have a rash on your right leg that is itchy but not painful.  -You have been prescribed Valtrex which should help decrease progression/improving healing if it is shingles. -Monitor the rash for increased pain or if it starts to spread in a line. -Your prescription has been sent to [facility] pharmacy.

## 2024-03-22 ENCOUNTER — Telehealth: Payer: Self-pay

## 2024-03-22 DIAGNOSIS — Z1239 Encounter for other screening for malignant neoplasm of breast: Secondary | ICD-10-CM

## 2024-03-22 DIAGNOSIS — Z01419 Encounter for gynecological examination (general) (routine) without abnormal findings: Secondary | ICD-10-CM

## 2024-03-22 DIAGNOSIS — Z1231 Encounter for screening mammogram for malignant neoplasm of breast: Secondary | ICD-10-CM

## 2024-03-22 NOTE — Telephone Encounter (Signed)
 That is fine.  Order placed.

## 2024-03-22 NOTE — Telephone Encounter (Signed)
 Patient would like to know if she could have her mammo order placed prior to her annual. Routing to Dr. Barbra.

## 2024-03-22 NOTE — Addendum Note (Signed)
 Addended by: BARBRA LANG PARAS on: 03/22/2024 03:37 PM   Modules accepted: Orders

## 2024-03-23 NOTE — Telephone Encounter (Signed)
 Spoke with patient and connected her with radiology for scheduling.

## 2024-03-29 ENCOUNTER — Ambulatory Visit (HOSPITAL_BASED_OUTPATIENT_CLINIC_OR_DEPARTMENT_OTHER)
Admission: RE | Admit: 2024-03-29 | Discharge: 2024-03-29 | Disposition: A | Discharge status: Home / Self Care | Source: Ambulatory Visit | Attending: Family Medicine | Admitting: Family Medicine

## 2024-03-29 ENCOUNTER — Encounter (HOSPITAL_BASED_OUTPATIENT_CLINIC_OR_DEPARTMENT_OTHER): Payer: Self-pay

## 2024-03-29 DIAGNOSIS — Z1231 Encounter for screening mammogram for malignant neoplasm of breast: Secondary | ICD-10-CM | POA: Insufficient documentation

## 2024-03-29 DIAGNOSIS — Z1239 Encounter for other screening for malignant neoplasm of breast: Secondary | ICD-10-CM

## 2024-04-03 ENCOUNTER — Ambulatory Visit: Payer: Self-pay | Admitting: Family Medicine

## 2024-04-12 ENCOUNTER — Ambulatory Visit (INDEPENDENT_AMBULATORY_CARE_PROVIDER_SITE_OTHER): Admitting: Family Medicine

## 2024-04-12 VITALS — BP 119/63 | HR 70 | Ht 64.0 in | Wt 148.0 lb

## 2024-04-12 DIAGNOSIS — Z01419 Encounter for gynecological examination (general) (routine) without abnormal findings: Secondary | ICD-10-CM | POA: Diagnosis not present

## 2024-04-12 NOTE — Progress Notes (Signed)
 ANNUAL EXAM Patient name: Erin Sampson MRN [id number]  Date of birth: Nov 15, 1945 Chief Complaint:   Annual Exam  History of Present Illness:   Erin Sampson is a 78 y.o.  No obstetric history on file.  female  being seen today for a routine annual exam.  Current complaints: none  No LMP recorded. Patient has had a hysterectomy.    Last pap > 65 - not needed Last mammogram: 02/2024. Results were: normal. Family h/o breast cancer: no Last colonoscopy:      04/12/2024    2:05 PM 03/20/2024   10:42 AM 07/05/2023    2:43 PM 02/15/2023    3:17 PM 01/04/2022   12:33 PM  Depression screen PHQ 2/9  Decreased Interest 0 0 0 0 0  Down, Depressed, Hopeless 0 0 0 0 0  PHQ - 2 Score 0 0 0 0 0  Altered sleeping 0 0     Tired, decreased energy 0 1     Change in appetite 0 0     Feeling bad or failure about yourself  0 0     Trouble concentrating 0 1     Moving slowly or fidgety/restless 0 0     Suicidal thoughts 0 0     PHQ-9 Score 0 2      Difficult doing work/chores  Not difficult at all        Data saved with a previous flowsheet row definition        04/12/2024    2:05 PM 03/20/2024   10:42 AM  GAD 7 : Generalized Anxiety Score  Nervous, Anxious, on Edge 0 0  Control/stop worrying 0 0  Worry too much - different things 0 0  Trouble relaxing 0 0  Restless 0 0  Easily annoyed or irritable 0 0  Afraid - awful might happen 0 0  Total GAD 7 Score 0 0  Anxiety Difficulty  Not difficult at all     Review of Systems:   Pertinent items are noted in HPI Denies any headaches, blurred vision, fatigue, shortness of breath, chest pain, abdominal pain, abnormal vaginal discharge/itching/odor/irritation, problems with periods, bowel movements, urination, or intercourse unless otherwise stated above. Pertinent History Reviewed:  Reviewed past medical,surgical, social and family history.  Reviewed problem list, medications and allergies. Physical Assessment:   Vitals:    04/12/24 1358  BP: 119/63  Pulse: 70  Weight: 148 lb (67.1 kg)  Height: 5' 4 (1.626 m)  Body mass index is 25.4 kg/m.        Physical Examination:   General appearance - well appearing, and in no distress  Mental status - alert, oriented to person, place, and time  Psych:  She has a normal mood and affect  Skin - warm and dry, normal color, no suspicious lesions noted  Chest - effort normal, all lung fields clear to auscultation bilaterally  Heart - normal rate and regular rhythm  Neck:  midline trachea, no thyromegaly or nodules  Breasts - breasts appear normal, no suspicious masses, no skin or nipple changes or axillary nodes  Abdomen - soft, nontender, nondistended, no masses or organomegaly  Pelvic - VULVA: normal appearing vulva with no masses, tenderness or lesions  VAGINA: normal appearing vagina with normal color and discharge, no lesions  CERVIX: normal appearing cervix without discharge or lesions, no CMT  UTERUS: absent  ADNEXA: No adnexal masses or tenderness noted.  Extremities:  No swelling or varicosities noted  Chaperone present for  exam  Assessment & Plan:  1. Well woman exam with routine gynecological exam (Primary)    No orders of the defined types were placed in this encounter.   Meds: No orders of the defined types were placed in this encounter.   Follow-up: No follow-ups on file.  Danise Dehne J Jannat Rosemeyer, DO 04/12/2024 2:30 PM

## 2024-05-14 ENCOUNTER — Encounter: Payer: Self-pay | Admitting: Cardiology

## 2024-05-14 ENCOUNTER — Ambulatory Visit: Admitting: Cardiology

## 2024-05-14 VITALS — BP 140/88 | HR 70 | Ht 64.0 in | Wt 151.0 lb

## 2024-05-14 DIAGNOSIS — E785 Hyperlipidemia, unspecified: Secondary | ICD-10-CM | POA: Diagnosis not present

## 2024-05-14 DIAGNOSIS — R0609 Other forms of dyspnea: Secondary | ICD-10-CM | POA: Insufficient documentation

## 2024-05-14 DIAGNOSIS — Z8249 Family history of ischemic heart disease and other diseases of the circulatory system: Secondary | ICD-10-CM

## 2024-05-14 DIAGNOSIS — G459 Transient cerebral ischemic attack, unspecified: Secondary | ICD-10-CM | POA: Diagnosis present

## 2024-05-14 DIAGNOSIS — R002 Palpitations: Secondary | ICD-10-CM

## 2024-05-14 NOTE — Addendum Note (Signed)
 Addended by: ARLOA PLANAS D on: 05/14/2024 03:31 PM   Modules accepted: Orders

## 2024-05-14 NOTE — Patient Instructions (Signed)
Medication Instructions:  Your physician recommends that you continue on your current medications as directed. Please refer to the Current Medication list given to you today.  *If you need a refill on your cardiac medications before your next appointment, please call your pharmacy*   Lab Work: None Ordered If you have labs (blood work) drawn today and your tests are completely normal, you will receive your results only by: MyChart Message (if you have MyChart) OR A paper copy in the mail If you have any lab test that is abnormal or we need to change your treatment, we will call you to review the results.   Testing/Procedures: We will order CT coronary calcium score. It will cost $99.00 and is not covered by insurance.  Please call to schedule.    [facility] [place] [address] [place], [place] [address] [phone]     Follow-Up: At [facility], you and your health needs are our priority.  As part of our continuing mission to provide you with exceptional heart care, we have created designated Provider Care Teams.  These Care Teams include your primary Cardiologist (physician) and Advanced Practice Providers (APPs -  Physician Assistants and Nurse Practitioners) who all work together to provide you with the care you need, when you need it.  We recommend signing up for the patient portal called "MyChart".  Sign up information is provided on this After Visit Summary.  MyChart is used to connect with patients for Virtual Visits (Telemedicine).  Patients are able to view lab/test results, encounter notes, upcoming appointments, etc.  Non-urgent messages can be sent to your provider as well.   To learn more about what you can do with MyChart, go to [email or web address].    Your next appointment:   12 month(s)  The format for your next appointment:   In Person  Provider:   Robert Krasowski, MD    Other Instructions NA  

## 2024-05-14 NOTE — Progress Notes (Signed)
 Cardiology Office Note:    Date:  05/14/2024   ID:  Erin Sampson, DOB 1945/06/02, MRN [id number]  PCP:  Dorina Loving, PA-C  Cardiologist:  Lamar Fitch, MD    Referring MD: Dorina Loving, [place]   Chief Complaint  Patient presents with   Annual Exam    History of Present Illness:    Erin Sampson is a 78 y.o. female past medical history significant for TIA that she suffered from in 2015, extensive workup done thereafter was negative, she also complained of having palpitations she wore a monitor which showed some supraventricular tachycardia, echocardiogram showed preserved left ventricle ejection fraction.  Comes today to myself follow-up doing well he she stopped drinking coffee stop drinking tea and palpitation completely subsided.  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  Denies have any chest pain tightness squeezing pressure burning chest.  Past Medical History:  Diagnosis Date   Allergy    Bursitis    Depression    Diverticulitis    Dyslipidemia 01/04/2023   IBS (irritable bowel syndrome)    Thyroid  nodule 12/31/2022    Past Surgical History:  Procedure Laterality Date   ABDOMINAL HYSTERECTOMY     for bleeding/polyps   APPENDECTOMY      Current Medications: Active Medications[1]   Allergies:   Codeine, Tea tree oil, and Other   Social History   Socioeconomic History   Marital status: Married    Spouse name: Not on file   Number of children: Not on file   Years of education: Not on file   Highest education level: Not on file  Occupational History   Occupation: retired  Tobacco Use   Smoking status: Former    Current packs/day: 0.00    Average packs/day: 1 pack/day for 20.0 years (20.0 ttl pk-yrs)    Types: Cigarettes    Start date: 03/16/1963    Quit date: 03/16/1983    Years since quitting: 41.1   Smokeless tobacco: Never   Tobacco comments:    Quit at age 39   Vaping Use   Vaping status: Never Used  Substance and  Sexual Activity   Alcohol use: No   Drug use: No   Sexual activity: Not Currently  Other Topics Concern   Not on file  Social History Narrative   Married, with 2 children   2 yrs of college   Right handed   3-4 cups daily   Social Drivers of Health   Tobacco Use: Medium Risk (05/14/2024)   Patient History    Smoking Tobacco Use: Former    Smokeless Tobacco Use: Never    Passive Exposure: Not on Actuary Strain: Low Risk (02/15/2023)   Overall Financial Resource Strain (CARDIA)    Difficulty of Paying Living Expenses: Not hard at all  Food Insecurity: No Food Insecurity (02/15/2023)   Hunger Vital Sign    Worried About Running Out of Food in the Last Year: Never true    Ran Out of Food in the Last Year: Never true  Transportation Needs: No Transportation Needs (02/15/2023)   PRAPARE - Administrator, Civil Service (Medical): No    Lack of Transportation (Non-Medical): No  Physical Activity: Inactive (02/15/2023)   Exercise Vital Sign    Days of Exercise per Week: 0 days    Minutes of Exercise per Session: 0 min  Stress: No Stress Concern Present (02/15/2023)   [employer] of Occupational Health - Occupational Stress Questionnaire    Feeling  of Stress : Not at all  Social Connections: Socially Integrated (02/15/2023)   Social Connection and Isolation Panel    Frequency of Communication with Friends and Family: More than three times a week    Frequency of Social Gatherings with Friends and Family: More than three times a week    Attends Religious Services: More than 4 times per year    Active Member of Clubs or Organizations: Yes    Attends Banker Meetings: More than 4 times per year    Marital Status: Married  Depression (PHQ2-9): Low Risk (04/12/2024)   Depression (PHQ2-9)    PHQ-2 Score: 0  Alcohol Screen: Low Risk (02/15/2023)   Alcohol Screen    Last Alcohol Screening Score (AUDIT): 0  Housing: Low Risk (02/15/2023)    Housing    Last Housing Risk Score: 0  Utilities: Not At Risk (02/15/2023)   AHC Utilities    Threatened with loss of utilities: No  Health Literacy: Adequate Health Literacy (02/15/2023)   B1300 Health Literacy    Frequency of need for help with medical instructions: Never     Family History: The patient's family history includes Breast cancer in her cousin. ROS:   Please see the history of present illness.    All 14 point review of systems negative except as described per history of present illness  EKGs/Labs/Other Studies Reviewed:         Recent Labs: 07/12/2023: ALT 12; BUN 12; Creatinine, Ser 0.73; Potassium 3.9; Sodium 136 11/09/2023: Hemoglobin 14.2; Platelets 299  Recent Lipid Panel    Component Value Date/Time   CHOL 229 (H) 01/04/2023 1030   TRIG 47 01/04/2023 1030   HDL 92 01/04/2023 1030   CHOLHDL 2.5 01/04/2023 1030   CHOLHDL 3 08/12/2020 1447   VLDL 17.0 08/12/2020 1447   LDLCALC 129 (H) 01/04/2023 1030    Physical Exam:    VS:  BP (!) 140/88   Pulse 70   Ht 5' 4 (1.626 m)   Wt 151 lb (68.5 kg)   SpO2 98%   BMI 25.92 kg/m     Wt Readings from Last 3 Encounters:  05/14/24 151 lb (68.5 kg)  04/12/24 148 lb (67.1 kg)  03/20/24 149 lb (67.6 kg)     GEN:  Well nourished, well developed in no acute distress HEENT: Normal NECK: No JVD; No carotid bruits LYMPHATICS: No lymphadenopathy CARDIAC: RRR, no murmurs, no rubs, no gallops RESPIRATORY:  Clear to auscultation without rales, wheezing or rhonchi  ABDOMEN: Soft, non-tender, non-distended MUSCULOSKELETAL:  No edema; No deformity  SKIN: Warm and dry LOWER EXTREMITIES: no swelling NEUROLOGIC:  Alert and oriented x 3 PSYCHIATRIC:  Normal affect   ASSESSMENT:    1. Palpitations   2. TIA (transient ischemic attack)   3. Dyslipidemia   4. Dyspnea on exertion    PLAN:    In order of problems listed above:  Palpitations completely gone with discontinuation of caffeinated drink.  Will continue  monitoring. Dyslipidemia I did review KPN which show me her LDL of 129 HDL 92, I ask her to have a calcium score done to see if anything need to be done about the LDL.  However after admit overall she is in excellent shape. Dyspnea exertion, denies having any   Medication Adjustments/Labs and Tests Ordered: Current medicines are reviewed at length with the patient today.  Concerns regarding medicines are outlined above.  Orders Placed This Encounter  Procedures   EKG 12-Lead   Medication changes:  No orders of the defined types were placed in this encounter.   Signed, Lamar DOROTHA Fitch, MD, [facility] 05/14/2024 3:25 PM    [facility]    [1]  Current Meds  Medication Sig   aspirin EC 81 MG tablet Take 81 mg by mouth daily. Swallow whole.   B Complex Vitamins (B COMPLEX 1 PO) Take 1 tablet by mouth daily.   calcium carbonate (OS-CAL) 600 MG TABS tablet Take 600 mg by mouth 2 (two) times daily with a meal.   cholecalciferol (VITAMIN D) 1000 UNITS tablet Take 5,000 Units by mouth daily.   magnesium gluconate (MAGONATE) 500 MG tablet Take 500 mg by mouth 2 (two) times daily.   Turmeric 1053 MG TABS Take 1 tablet by mouth daily.

## 2024-06-05 ENCOUNTER — Ambulatory Visit (INDEPENDENT_AMBULATORY_CARE_PROVIDER_SITE_OTHER)

## 2024-06-05 VITALS — BP 120/60 | HR 70 | Temp 97.6°F | Ht 64.0 in | Wt 152.0 lb

## 2024-06-05 DIAGNOSIS — Z0000 Encounter for general adult medical examination without abnormal findings: Secondary | ICD-10-CM

## 2024-06-05 NOTE — Patient Instructions (Addendum)
 Erin Sampson,  Thank you for taking the time for your Medicare Wellness Visit. I appreciate your continued commitment to your health goals. Please review the care plan we discussed, and feel free to reach out if I can assist you further.  Please note that Annual Wellness Visits do not include a physical exam. Some assessments may be limited, especially if the visit was conducted virtually. If needed, we may recommend an in-person follow-up with your provider.  Ongoing Care Seeing your primary care provider every 3 to 6 months helps us  monitor your health and provide consistent, personalized care.   Referrals If a referral was made during today's visit and you haven't received any updates within two weeks, please contact the referred provider directly to check on the status.  Recommended Screenings:  Health Maintenance  Topic Date Due   Zoster (Shingles) Vaccine (1 of 2) 03/17/1963   Osteoporosis screening with Bone Density Scan  04/13/2025   Medicare Annual Wellness Visit  06/05/2025   DTaP/Tdap/Td vaccine (2 - Td or Tdap) 11/24/2025   Pneumococcal Vaccine for age over 47  Completed   Flu Shot  Completed   Meningitis B Vaccine  Aged Out   Breast Cancer Screening  Discontinued   COVID-19 Vaccine  Discontinued   Hepatitis C Screening  Discontinued       06/05/2024   11:27 AM  Advanced Directives  Does Patient Have a Medical Advance Directive? Yes  Type of Estate Agent of [place];Living will  Does patient want to make changes to medical advance directive? No - Patient declined  Copy of Healthcare Power of Attorney in Chart? No - copy requested    Vision: Annual vision screenings are recommended for early detection of glaucoma, cataracts, and diabetic retinopathy. These exams can also reveal signs of chronic conditions such as diabetes and high blood pressure.  Dental: Annual dental screenings help detect early signs of oral cancer, gum disease, and other  conditions linked to overall health, including heart disease and diabetes.  Please see the attached documents for additional preventive care recommendations.

## 2024-06-05 NOTE — Progress Notes (Signed)
 "  Chief Complaint  Patient presents with   Medicare Wellness     Subjective:   Erin Sampson is a 79 y.o. female who presents for a Medicare Annual Wellness Visit.  Visit info / Clinical Intake: Medicare Wellness Visit Type:: Subsequent Annual Wellness Visit Persons participating in visit and providing information:: patient Medicare Wellness Visit Mode:: In-person (required for WTM) Interpreter Needed?: No Pre-visit prep was completed: yes AWV questionnaire completed by patient prior to visit?: no Living arrangements:: lives with spouse/significant other Patient's Overall Health Status Rating: very good Typical amount of pain: some Does pain affect daily life?: no Are you currently prescribed opioids?: no  Dietary Habits and Nutritional Risks How many meals a day?: 2 Eats fruit and vegetables daily?: yes Most meals are obtained by: preparing own meals In the last 2 weeks, have you had any of the following?: none Diabetic:: no  Functional Status Activities of Daily Living (to include ambulation/medication): Independent Ambulation: Independent with device- listed below Home Assistive Devices/Equipment: Eyeglasses Medication Administration: Independent Home Management (perform basic housework or laundry): Independent Manage your own finances?: yes Primary transportation is: driving Concerns about vision?: no *vision screening is required for WTM* Concerns about hearing?: no  Fall Screening Falls in the past year?: 0 Number of falls in past year: 0 Was there an injury with Fall?: 0 Fall Risk Category Calculator: 0 Patient Fall Risk Level: Low Fall Risk  Fall Risk Patient at Risk for Falls Due to: No Fall Risks Fall risk Follow up: Falls evaluation completed  Home and Transportation Safety: All rugs have non-skid backing?: yes All stairs or steps have railings?: (!) no Grab bars in the bathtub or shower?: yes Have non-skid surface in bathtub or shower?: yes Good  home lighting?: yes Regular seat belt use?: yes Hospital stays in the last year:: no  Cognitive Assessment Difficulty concentrating, remembering, or making decisions? : no Will 6CIT or Mini Cog be Completed: yes What year is it?: 0 points What month is it?: 0 points Give patient an address phrase to remember (5 components): [address] [place]  About what time is it?: 0 points Count backwards from 20 to 1: 0 points Say the months of the year in reverse: 0 points Repeat the address phrase from earlier: 0 points 6 CIT Score: 0 points  Advance Directives (For Healthcare) Does Patient Have a Medical Advance Directive?: Yes Does patient want to make changes to medical advance directive?: No - Patient declined Type of Advance Directive: Healthcare Power of [place]; Living will Copy of Healthcare Power of Attorney in Chart?: No - copy requested Copy of Living Will in Chart?: No - copy requested  Reviewed/Updated  Reviewed/Updated: Reviewed All (Medical, Surgical, Family, Medications, Allergies, Care Teams, Patient Goals)    Allergies (verified) Codeine, Tea tree oil, and Other   Current Medications (verified) Outpatient Encounter Medications as of 06/05/2024  Medication Sig   aspirin EC 81 MG tablet Take 81 mg by mouth daily. Swallow whole.   B Complex Vitamins (B COMPLEX 1 PO) Take 1 tablet by mouth daily.   calcium carbonate (OS-CAL) 600 MG TABS tablet Take 600 mg by mouth 2 (two) times daily with a meal.   cholecalciferol (VITAMIN D) 1000 UNITS tablet Take 5,000 Units by mouth daily.   magnesium gluconate (MAGONATE) 500 MG tablet Take 500 mg by mouth 2 (two) times daily.   Turmeric 1053 MG TABS Take 1 tablet by mouth daily.   No facility-administered encounter medications on file as  of 06/05/2024.    History: Past Medical History:  Diagnosis Date   Allergy    Bursitis    Depression    Diverticulitis    Dyslipidemia 01/04/2023   IBS (irritable bowel syndrome)     Thyroid  nodule 12/31/2022   Past Surgical History:  Procedure Laterality Date   ABDOMINAL HYSTERECTOMY     for bleeding/polyps   APPENDECTOMY     Family History  Problem Relation Age of Onset   Breast cancer Cousin    Social History   Occupational History   Occupation: retired  Tobacco Use   Smoking status: Former    Current packs/day: 0.00    Average packs/day: 1 pack/day for 20.0 years (20.0 ttl pk-yrs)    Types: Cigarettes    Start date: 03/16/1963    Quit date: 03/16/1983    Years since quitting: 41.2   Smokeless tobacco: Never   Tobacco comments:    Quit at age 72   Vaping Use   Vaping status: Never Used  Substance and Sexual Activity   Alcohol use: No   Drug use: No   Sexual activity: Not Currently   Tobacco Counseling Counseling given: No Tobacco comments: Quit at age 65   SDOH Screenings   Food Insecurity: No Food Insecurity (06/05/2024)  Housing: Unknown (06/05/2024)  Transportation Needs: No Transportation Needs (06/05/2024)  Utilities: Not At Risk (06/05/2024)  Alcohol Screen: Low Risk (02/15/2023)  Depression (PHQ2-9): Low Risk (06/05/2024)  Financial Resource Strain: Low Risk (02/15/2023)  Physical Activity: Inactive (06/05/2024)  Social Connections: Socially Integrated (06/05/2024)  Stress: No Stress Concern Present (06/05/2024)  Tobacco Use: Medium Risk (06/05/2024)  Health Literacy: Adequate Health Literacy (06/05/2024)   See flowsheets for full screening details  Depression Screen PHQ 2 & 9 Depression Scale- Over the past 2 weeks, how often have you been bothered by any of the following problems? Little interest or pleasure in doing things: 0 Feeling down, depressed, or hopeless (PHQ Adolescent also includes...irritable): 0 PHQ-2 Total Score: 0 Trouble falling or staying asleep, or sleeping too much: 0 Feeling tired or having little energy: 0 Poor appetite or overeating (PHQ Adolescent also includes...weight loss): 0 Feeling bad about yourself - or that you are  a failure or have let yourself or your family down: 0 Trouble concentrating on things, such as reading the newspaper or watching television (PHQ Adolescent also includes...like school work): 0 Moving or speaking so slowly that other people could have noticed. Or the opposite - being so fidgety or restless that you have been moving around a lot more than usual: 0 Thoughts that you would be better off dead, or of hurting yourself in some way: 0 PHQ-9 Total Score: 0 If you checked off any problems, how difficult have these problems made it for you to do your work, take care of things at home, or get along with other people?: Not difficult at all     Goals Addressed               This Visit's Progress     Remain active (pt-stated)               Objective:    Today's Vitals   06/05/24 1113  BP: 120/60  Pulse: 70  Temp: 97.6 F (36.4 C)  TempSrc: Oral  SpO2: 95%  Weight: 152 lb (68.9 kg)  Height: 5' 4 (1.626 m)   Body mass index is 26.09 kg/m.  Hearing/Vision screen Hearing Screening - Comments:: Denies hearing difficulties  Vision Screening - Comments:: Wears rx glasses - up to date with routine eye exams with  Dr Octavia Immunizations and Health Maintenance Health Maintenance  Topic Date Due   Zoster Vaccines- Shingrix (1 of 2) 03/17/1963   Bone Density Scan  04/13/2025   Medicare Annual Wellness (AWV)  06/05/2025   DTaP/Tdap/Td (2 - Td or Tdap) 11/24/2025   Pneumococcal Vaccine: 50+ Years  Completed   Influenza Vaccine  Completed   Meningococcal B Vaccine  Aged Out   Mammogram  Discontinued   COVID-19 Vaccine  Discontinued   Hepatitis C Screening  Discontinued        Assessment/Plan:  This is a routine wellness examination for [place].  Patient Care Team: [name], Edward, PA-C as PCP - General (Internal Medicine)  I have personally reviewed and noted the following in the patients chart:   Medical and social history Use of alcohol, tobacco or illicit  drugs  Current medications and supplements including opioid prescriptions. Functional ability and status Nutritional status Physical activity Advanced directives List of other physicians Hospitalizations, surgeries, and ER visits in previous 12 months Vitals Screenings to include cognitive, depression, and falls Referrals and appointments  No orders of the defined types were placed in this encounter.  In addition, I have reviewed and discussed with patient certain preventive protocols, quality metrics, and best practice recommendations. A written personalized care plan for preventive services as well as general preventive health recommendations were provided to patient.   Rojelio LELON Blush, LPN   01/01/7972   Return in 53 weeks (on 06/11/2025).  After Visit Summary: (In Person-Printed) AVS printed and given to the patient  Nurse Notes: No voiced or noted concerns at this time "

## 2024-06-20 ENCOUNTER — Other Ambulatory Visit (HOSPITAL_BASED_OUTPATIENT_CLINIC_OR_DEPARTMENT_OTHER)
